# Patient Record
Sex: Female | Born: 1960 | Race: White | Hispanic: No | Marital: Single | Smoking: Current every day smoker
Health system: Southern US, Community
[De-identification: ages and names within clinical notes are randomized; demographics above are authoritative.]

## PROBLEM LIST (undated history)

## (undated) DIAGNOSIS — F419 Anxiety disorder, unspecified: Secondary | ICD-10-CM

## (undated) DIAGNOSIS — J449 Chronic obstructive pulmonary disease, unspecified: Secondary | ICD-10-CM

## (undated) DIAGNOSIS — G8929 Other chronic pain: Secondary | ICD-10-CM

## (undated) DIAGNOSIS — G4700 Insomnia, unspecified: Secondary | ICD-10-CM

## (undated) DIAGNOSIS — Z8639 Personal history of other endocrine, nutritional and metabolic disease: Secondary | ICD-10-CM

## (undated) DIAGNOSIS — G629 Polyneuropathy, unspecified: Secondary | ICD-10-CM

## (undated) DIAGNOSIS — E7800 Pure hypercholesterolemia, unspecified: Secondary | ICD-10-CM

## (undated) HISTORY — PX: ABLATION: SHX5711

## (undated) HISTORY — DX: Insomnia, unspecified: G47.00

## (undated) HISTORY — DX: Chronic obstructive pulmonary disease, unspecified: J44.9

## (undated) HISTORY — DX: Anxiety disorder, unspecified: F41.9

## (undated) HISTORY — DX: Other chronic pain: G89.29

## (undated) HISTORY — PX: ABDOMINAL HYSTERECTOMY: SHX81

## (undated) HISTORY — PX: LAPAROSCOPIC SALPINGO OOPHERECTOMY: SHX5927

## (undated) HISTORY — DX: Pure hypercholesterolemia, unspecified: E78.00

## (undated) HISTORY — DX: Personal history of other endocrine, nutritional and metabolic disease: Z86.39

## (undated) HISTORY — DX: Polyneuropathy, unspecified: G62.9

---

## 2013-11-08 ENCOUNTER — Emergency Department (HOSPITAL_COMMUNITY)
Admission: EM | Admit: 2013-11-08 | Discharge: 2013-11-08 | Disposition: A | Discharge status: Home / Self Care | Payer: Self-pay | Attending: Emergency Medicine | Admitting: Emergency Medicine

## 2013-11-08 ENCOUNTER — Encounter (HOSPITAL_COMMUNITY): Payer: Self-pay | Admitting: Emergency Medicine

## 2013-11-08 ENCOUNTER — Emergency Department (HOSPITAL_COMMUNITY): Payer: Self-pay

## 2013-11-08 DIAGNOSIS — M5430 Sciatica, unspecified side: Secondary | ICD-10-CM | POA: Insufficient documentation

## 2013-11-08 DIAGNOSIS — M5432 Sciatica, left side: Secondary | ICD-10-CM

## 2013-11-08 DIAGNOSIS — F17200 Nicotine dependence, unspecified, uncomplicated: Secondary | ICD-10-CM | POA: Insufficient documentation

## 2013-11-08 LAB — URINALYSIS, ROUTINE W REFLEX MICROSCOPIC
BILIRUBIN URINE: NEGATIVE
Glucose, UA: NEGATIVE mg/dL
HGB URINE DIPSTICK: NEGATIVE
KETONES UR: NEGATIVE mg/dL
Leukocytes, UA: NEGATIVE
NITRITE: NEGATIVE
PROTEIN: NEGATIVE mg/dL
Specific Gravity, Urine: 1.014 (ref 1.005–1.030)
UROBILINOGEN UA: 0.2 mg/dL (ref 0.0–1.0)
pH: 7 (ref 5.0–8.0)

## 2013-11-08 MED ORDER — DIAZEPAM 5 MG PO TABS: 5.0000 mg | ORAL_TABLET | Freq: Four times a day (QID) | ORAL | Status: DC | PRN

## 2013-11-08 MED ORDER — OXYCODONE-ACETAMINOPHEN 5-325 MG PO TABS
1.0000 | ORAL_TABLET | Freq: Once | ORAL | Status: AC
  Administered 2013-11-08: 1 via ORAL
  Filled 2013-11-08: qty 1

## 2013-11-08 MED ORDER — DIAZEPAM 5 MG PO TABS
5.0000 mg | ORAL_TABLET | Freq: Once | ORAL | Status: AC
  Administered 2013-11-08: 5 mg via ORAL
  Filled 2013-11-08: qty 1

## 2013-11-08 MED ORDER — OXYCODONE-ACETAMINOPHEN 5-325 MG PO TABS: 1.0000 | ORAL_TABLET | ORAL | Status: DC | PRN

## 2013-11-08 NOTE — ED Notes (Signed)
Patient transported to X-ray 

## 2013-11-08 NOTE — ED Provider Notes (Signed)
CSN: [id number]     Arrival date & time 11/08/13  1047 History  This chart was scribed for non-physician practitioner, Mardee Postin, working with Shon Baton, MD by Smiley Houseman, ED Scribe. This patient was seen in room WTR7/WTR7 and the patient's care was started at 12:15 PM.  Chief Complaint  Patient presents with  . Back Pain   The history is provided by the patient. No language interpreter was used.   HPI Comments: Terri Cameron is a 53 y.o. female who presents to the Emergency Department complaining of constant back pain that started about 11 days ago.  Pt states that the pain began when she would move a certain way, but currently the pain is constant.  She reports the pain radiates into her hips bilaterally, but denies radiation into her abdomen.  Pt denies any known injury to the area.  She denies bowel incontinence or urinary difficulties.  Pt reports she has tried tylenol and ibuprofen without relief.  Pt has h/o of sciatica, but she denies this instance as similar pain.   History reviewed. No pertinent past medical history. Past Surgical History  Procedure Laterality Date  . Laparoscopic salpingo oopherectomy    . Ablation     No family history on file. History  Substance Use Topics  . Smoking status: Current Every Day Smoker -- 1.00 packs/day    Types: Cigarettes  . Smokeless tobacco: Not on file  . Alcohol Use: Not on file   OB History   Grav Para Term Preterm Abortions TAB SAB Ect Mult Living                 Review of Systems  Constitutional: Negative for fever and chills.  HENT: Negative for congestion, rhinorrhea and sore throat.   Respiratory: Negative for cough and shortness of breath.   Cardiovascular: Negative for chest pain.  Gastrointestinal: Negative for nausea, vomiting, abdominal pain and diarrhea.  Genitourinary: Negative for difficulty urinating.  Musculoskeletal: Positive for back pain. Negative for neck pain and neck stiffness.  Skin:  Negative for color change and rash.  Neurological: Negative for syncope.  All other systems reviewed and are negative.    Allergies  Review of patient's allergies indicates no known allergies.  Home Medications   Current Outpatient Rx  Name  Route  Sig  Dispense  Refill  . acetaminophen (TYLENOL) 325 MG tablet   Oral   Take 650 mg by mouth every 6 (six) hours as needed.         Marland Kitchen ibuprofen (ADVIL,MOTRIN) 200 MG tablet   Oral   Take 400 mg by mouth every 6 (six) hours as needed.          Triage Vitals: BP 124/59  Pulse 86  Temp(Src) 98.1 F (36.7 C)  Resp 20  SpO2 98% Physical Exam  Nursing note and vitals reviewed. Constitutional: She is oriented to person, place, and time. She appears well-developed and well-nourished. No distress.  HENT:  Head: Normocephalic and atraumatic.  Right Ear: External ear normal.  Left Ear: External ear normal.  Nose: Nose normal.  Mouth/Throat: Oropharynx is clear and moist. No oropharyngeal exudate.  Eyes: Conjunctivae are normal. Pupils are equal, round, and reactive to light. No scleral icterus.  Neck: Normal range of motion. Neck supple.  Cardiovascular: Normal rate, regular rhythm and normal heart sounds.  Exam reveals no gallop and no friction rub.   No murmur heard. Pulmonary/Chest: Effort normal and breath sounds normal. No respiratory distress. She has  no wheezes. She has no rales. She exhibits no tenderness.  Abdominal: Soft. Bowel sounds are normal. She exhibits no distension. There is no tenderness.  Musculoskeletal: She exhibits tenderness. She exhibits no edema.       Lumbar back: She exhibits decreased range of motion, tenderness and bony tenderness.       Back:  Lymphadenopathy:    She has no cervical adenopathy.  Neurological: She is alert and oriented to person, place, and time. She exhibits normal muscle tone. Coordination normal.  Skin: Skin is warm and dry. No rash noted. No erythema.  Psychiatric: She has a  normal mood and affect. Her behavior is normal. Judgment and thought content normal.    ED Course  Procedures (including critical care time) DIAGNOSTIC STUDIES: Oxygen Saturation is 98% on RA, normal by my interpretation.    COORDINATION OF CARE: 12:15 PM- Will order lumbar spine x-ray and pain medication. Patient informed of current plan of treatment and evaluation and agrees with plan.    Labs Review Labs Reviewed  URINALYSIS, ROUTINE W REFLEX MICROSCOPIC   Imaging Review Dg Lumbar Spine Complete  11/08/2013   CLINICAL DATA:  Low left-sided back pain  EXAM: LUMBAR SPINE - COMPLETE 4+ VIEW  COMPARISON:  None.  FINDINGS: There are 5 nonrib bearing lumbar-type vertebral bodies. The vertebral body heights are maintained. The alignment is anatomic. There is no spondylolysis. There is no acute fracture or static listhesis. There is mild degenerative disc disease at L4-5 and L5-S1.  The SI joints are unremarkable.  There is abdominal aortic atherosclerosis.  IMPRESSION: No acute osseous injury of the lumbar spine.   Electronically Signed   By: Elige KoHetal  Patel   On: 11/08/2013 13:20   MDM  Sciatica left  Patient here with left lower back pain with radiation around to left hip - pain worse with movement, no alarming signs to suggest cauda equina, x-ray negative as is urine.  I personally performed the services described in this documentation, which was scribed in my presence. The recorded information has been reviewed and is accurate.   Izola PriceFrances C. Marisue HumbleSanford, PA-C 11/08/13 1346

## 2013-11-08 NOTE — Discharge Instructions (Signed)
Lumbosacral Strain °Lumbosacral strain is a strain of any of the parts that make up your lumbosacral vertebrae. Your lumbosacral vertebrae are the bones that make up the lower third of your backbone. Your lumbosacral vertebrae are held together by muscles and tough, fibrous tissue (ligaments).  °CAUSES  °A sudden blow to your back can cause lumbosacral strain. Also, anything that causes an excessive stretch of the muscles in the low back can cause this strain. This is typically seen when people exert themselves strenuously, fall, lift heavy objects, bend, or crouch repeatedly. °RISK FACTORS °· Physically demanding work. °· Participation in pushing or pulling sports or sports that require sudden twist of the back (tennis, golf, baseball). °· Weight lifting. °· Excessive lower back curvature. °· Forward-tilted pelvis. °· Weak back or abdominal muscles or both. °· Tight hamstrings. °SIGNS AND SYMPTOMS  °Lumbosacral strain may cause pain in the area of your injury or pain that moves (radiates) down your leg.  °DIAGNOSIS °Your health care provider can often diagnose lumbosacral strain through a physical exam. In some cases, you may need tests such as X-ray exams.  °TREATMENT  °Treatment for your lower back injury depends on many factors that your clinician will have to evaluate. However, most treatment will include the use of anti-inflammatory medicines. °HOME CARE INSTRUCTIONS  °· Avoid hard physical activities (tennis, racquetball, waterskiing) if you are not in proper physical condition for it. This may aggravate or create problems. °· If you have a back problem, avoid sports requiring sudden body movements. Swimming and walking are generally safer activities. °· Maintain good posture. °· Maintain a healthy weight. °· For acute conditions, you may put ice on the injured area. °· Put ice in a plastic bag. °· Place a towel between your skin and the bag. °· Leave the ice on for 20 minutes, 2 3 times a day. °· When the  low back starts healing, stretching and strengthening exercises may be recommended. °SEEK MEDICAL CARE IF: °· Your back pain is getting worse. °· You experience severe back pain not relieved with medicines. °SEEK IMMEDIATE MEDICAL CARE IF:  °· You have numbness, tingling, weakness, or problems with the use of your arms or legs. °· There is a change in bowel or bladder control. °· You have increasing pain in any area of the body, including your belly (abdomen). °· You notice shortness of breath, dizziness, or feel faint. °· You feel sick to your stomach (nauseous), are throwing up (vomiting), or become sweaty. °· You notice discoloration of your toes or legs, or your feet get very cold. °MAKE SURE YOU:  °· Understand these instructions. °· Will watch your condition. °· Will get help right away if you are not doing well or get worse. °Document Released: 07/10/2005 Document Revised: 07/21/2013 Document Reviewed: 05/19/2013 °ExitCare® Patient Information ©2014 [employer]. ° °Sciatica °Sciatica is pain, weakness, numbness, or tingling along the path of the sciatic nerve. The nerve starts in the lower back and runs down the back of each leg. The nerve controls the muscles in the lower leg and in the back of the knee, while also providing sensation to the back of the thigh, lower leg, and the sole of your foot. Sciatica is a symptom of another medical condition. For instance, nerve damage or certain conditions, such as a herniated disk or bone spur on the spine, pinch or put pressure on the sciatic nerve. This causes the pain, weakness, or other sensations normally associated with sciatica. Generally, sciatica only affects   one side of the body. °CAUSES  °· Herniated or slipped disc. °· Degenerative disk disease. °· A pain disorder involving the narrow muscle in the buttocks (piriformis syndrome). °· Pelvic injury or fracture. °· Pregnancy. °· Tumor (rare). °SYMPTOMS  °Symptoms can vary from mild to very severe. The  symptoms usually travel from the low back to the buttocks and down the back of the leg. Symptoms can include: °· Mild tingling or dull aches in the lower back, leg, or hip. °· Numbness in the back of the calf or sole of the foot. °· Burning sensations in the lower back, leg, or hip. °· Sharp pains in the lower back, leg, or hip. °· Leg weakness. °· Severe back pain inhibiting movement. °These symptoms may get worse with coughing, sneezing, laughing, or prolonged sitting or standing. Also, being overweight may worsen symptoms. °DIAGNOSIS  °Your caregiver will perform a physical exam to look for common symptoms of sciatica. He or she may ask you to do certain movements or activities that would trigger sciatic nerve pain. Other tests may be performed to find the cause of the sciatica. These may include: °· Blood tests. °· X-rays. °· Imaging tests, such as an MRI or CT scan. °TREATMENT  °Treatment is directed at the cause of the sciatic pain. Sometimes, treatment is not necessary and the pain and discomfort goes away on its own. If treatment is needed, your caregiver may suggest: °· Over-the-counter medicines to relieve pain. °· Prescription medicines, such as anti-inflammatory medicine, muscle relaxants, or narcotics. °· Applying heat or ice to the painful area. °· Steroid injections to lessen pain, irritation, and inflammation around the nerve. °· Reducing activity during periods of pain. °· Exercising and stretching to strengthen your abdomen and improve flexibility of your spine. Your caregiver may suggest losing weight if the extra weight makes the back pain worse. °· Physical therapy. °· Surgery to eliminate what is pressing or pinching the nerve, such as a bone spur or part of a herniated disk. °HOME CARE INSTRUCTIONS  °· Only take over-the-counter or prescription medicines for pain or discomfort as directed by your caregiver. °· Apply ice to the affected area for 20 minutes, 3 4 times a day for the first 48 72  hours. Then try heat in the same way. °· Exercise, stretch, or perform your usual activities if these do not aggravate your pain. °· Attend physical therapy sessions as directed by your caregiver. °· Keep all follow-up appointments as directed by your caregiver. °· Do not wear high heels or shoes that do not provide proper support. °· Check your mattress to see if it is too soft. A firm mattress may lessen your pain and discomfort. °SEEK IMMEDIATE MEDICAL CARE IF:  °· You lose control of your bowel or bladder (incontinence). °· You have increasing weakness in the lower back, pelvis, buttocks, or legs. °· You have redness or swelling of your back. °· You have a burning sensation when you urinate. °· You have pain that gets worse when you lie down or awakens you at night. °· Your pain is worse than you have experienced in the past. °· Your pain is lasting longer than 4 weeks. °· You are suddenly losing weight without reason. °MAKE SURE YOU: °· Understand these instructions. °· Will watch your condition. °· Will get help right away if you are not doing well or get worse. °Document Released: 09/24/2001 Document Revised: 03/31/2012 Document Reviewed: 02/09/2012 °ExitCare® Patient Information ©2014 [employer]. ° °

## 2013-11-08 NOTE — ED Provider Notes (Signed)
Medical screening examination/treatment/procedure(s) were performed by non-physician practitioner and as supervising physician I was immediately available for consultation/collaboration.  EKG Interpretation   None        Courtney F Horton, MD 11/08/13 1936 

## 2013-11-08 NOTE — Progress Notes (Signed)
[facility] provided pt with a list of primary care resources, ACA information, and a [facility] Orange Card application.  °

## 2013-11-08 NOTE — ED Notes (Signed)
Pt c/o lower back pain; previous history of sciatica

## 2013-12-31 ENCOUNTER — Encounter (HOSPITAL_COMMUNITY): Payer: Self-pay | Admitting: Emergency Medicine

## 2013-12-31 ENCOUNTER — Emergency Department (HOSPITAL_COMMUNITY): Payer: Worker's Compensation

## 2013-12-31 ENCOUNTER — Emergency Department (HOSPITAL_COMMUNITY)
Admission: EM | Admit: 2013-12-31 | Discharge: 2013-12-31 | Disposition: A | Discharge status: Home / Self Care | Payer: Worker's Compensation | Attending: Emergency Medicine | Admitting: Emergency Medicine

## 2013-12-31 DIAGNOSIS — S161XXA Strain of muscle, fascia and tendon at neck level, initial encounter: Secondary | ICD-10-CM

## 2013-12-31 DIAGNOSIS — R110 Nausea: Secondary | ICD-10-CM | POA: Insufficient documentation

## 2013-12-31 DIAGNOSIS — S46909A Unspecified injury of unspecified muscle, fascia and tendon at shoulder and upper arm level, unspecified arm, initial encounter: Secondary | ICD-10-CM | POA: Insufficient documentation

## 2013-12-31 DIAGNOSIS — S139XXA Sprain of joints and ligaments of unspecified parts of neck, initial encounter: Secondary | ICD-10-CM | POA: Insufficient documentation

## 2013-12-31 DIAGNOSIS — F17200 Nicotine dependence, unspecified, uncomplicated: Secondary | ICD-10-CM | POA: Insufficient documentation

## 2013-12-31 DIAGNOSIS — M549 Dorsalgia, unspecified: Secondary | ICD-10-CM

## 2013-12-31 DIAGNOSIS — Y92410 Unspecified street and highway as the place of occurrence of the external cause: Secondary | ICD-10-CM | POA: Insufficient documentation

## 2013-12-31 DIAGNOSIS — Y9389 Activity, other specified: Secondary | ICD-10-CM | POA: Insufficient documentation

## 2013-12-31 DIAGNOSIS — IMO0002 Reserved for concepts with insufficient information to code with codable children: Secondary | ICD-10-CM | POA: Insufficient documentation

## 2013-12-31 DIAGNOSIS — R42 Dizziness and giddiness: Secondary | ICD-10-CM | POA: Insufficient documentation

## 2013-12-31 DIAGNOSIS — Z9079 Acquired absence of other genital organ(s): Secondary | ICD-10-CM | POA: Insufficient documentation

## 2013-12-31 DIAGNOSIS — S4980XA Other specified injuries of shoulder and upper arm, unspecified arm, initial encounter: Secondary | ICD-10-CM | POA: Insufficient documentation

## 2013-12-31 DIAGNOSIS — G8929 Other chronic pain: Secondary | ICD-10-CM | POA: Insufficient documentation

## 2013-12-31 MED ORDER — OXYCODONE-ACETAMINOPHEN 5-325 MG PO TABS
2.0000 | ORAL_TABLET | Freq: Once | ORAL | Status: AC
  Administered 2013-12-31: 2 via ORAL
  Filled 2013-12-31: qty 2

## 2013-12-31 MED ORDER — METHOCARBAMOL 500 MG PO TABS
500.0000 mg | ORAL_TABLET | Freq: Once | ORAL | Status: AC
  Administered 2013-12-31: 500 mg via ORAL
  Filled 2013-12-31: qty 1

## 2013-12-31 MED ORDER — MELOXICAM 7.5 MG PO TABS: 15.0000 mg | ORAL_TABLET | Freq: Every day | ORAL | Status: DC

## 2013-12-31 MED ORDER — HYDROCODONE-ACETAMINOPHEN 5-325 MG PO TABS: 1.0000 | ORAL_TABLET | Freq: Four times a day (QID) | ORAL | Status: DC | PRN

## 2013-12-31 MED ORDER — METHOCARBAMOL 500 MG PO TABS: 500.0000 mg | ORAL_TABLET | Freq: Two times a day (BID) | ORAL | Status: DC
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
## 2013-12-31 NOTE — Discharge Instructions (Signed)
Recommend ice to the area 3 times per day for the next 72 hours followed by alternating ice and heat packs. Take Mobic as prescribed for pain and inflammation and Robaxin as prescribed for muscle spasms. You may take Norco as prescribed for breakthrough pain control. Followup with your primary care doctor. Return if symptoms worsen.  Cervical Sprain A cervical sprain is an injury in the neck in which the strong, fibrous tissues (ligaments) that connect your neck bones stretch or tear. Cervical sprains can range from mild to severe. Severe cervical sprains can cause the neck vertebrae to be unstable. This can lead to damage of the spinal cord and can result in serious nervous system problems. The amount of time it takes for a cervical sprain to get better depends on the cause and extent of the injury. Most cervical sprains heal in 1 to 3 weeks. CAUSES  Severe cervical sprains may be caused by:   Contact sport injuries (such as from football, rugby, wrestling, hockey, auto racing, gymnastics, diving, martial arts, or boxing).   Motor vehicle collisions.   Whiplash injuries. This is an injury from a sudden forward-and backward whipping movement of the head and neck.  Falls.  Mild cervical sprains may be caused by:   Being in an awkward position, such as while cradling a telephone between your ear and shoulder.   Sitting in a chair that does not offer proper support.   Working at a poorly Marketing executivedesigned computer station.   Looking up or down for long periods of time.  SYMPTOMS   Pain, soreness, stiffness, or a burning sensation in the front, back, or sides of the neck. This discomfort may develop immediately after the injury or slowly, 24 hours or more after the injury.   Pain or tenderness directly in the middle of the back of the neck.   Shoulder or upper back pain.   Limited ability to move the neck.   Headache.   Dizziness.   Weakness, numbness, or tingling in the hands  or arms.   Muscle spasms.   Difficulty swallowing or chewing.   Tenderness and swelling of the neck.  DIAGNOSIS  Most of the time your health care provider can diagnose a cervical sprain by taking your history and doing a physical exam. Your health care provider will ask about previous neck injuries and any known neck problems, such as arthritis in the neck. X-rays may be taken to find out if there are any other problems, such as with the bones of the neck. Other tests, such as a CT scan or MRI, may also be needed.  TREATMENT  Treatment depends on the severity of the cervical sprain. Mild sprains can be treated with rest, keeping the neck in place (immobilization), and pain medicines. Severe cervical sprains are immediately immobilized. Further treatment is done to help with pain, muscle spasms, and other symptoms and may include:  Medicines, such as pain relievers, numbing medicines, or muscle relaxants.   Physical therapy. This may involve stretching exercises, strengthening exercises, and posture training. Exercises and improved posture can help stabilize the neck, strengthen muscles, and help stop symptoms from returning.  HOME CARE INSTRUCTIONS   Put ice on the injured area.   Put ice in a plastic bag.   Place a towel between your skin and the bag.   Leave the ice on for 15 20 minutes, 3 4 times a day.   If your injury was severe, you may have been given a cervical collar  to wear. A cervical collar is a two-piece collar designed to keep your neck from moving while it heals.  Do not remove the collar unless instructed by your health care provider.  If you have long hair, keep it outside of the collar.  Ask your health care provider before making any adjustments to your collar. Minor adjustments may be required over time to improve comfort and reduce pressure on your chin or on the back of your head.  Ifyou are allowed to remove the collar for cleaning or bathing,  follow your health care provider's instructions on how to do so safely.  Keep your collar clean by wiping it with mild soap and water and drying it completely. If the collar you have been given includes removable pads, remove them every 1 2 days and hand wash them with soap and water. Allow them to air dry. They should be completely dry before you wear them in the collar.  If you are allowed to remove the collar for cleaning and bathing, wash and dry the skin of your neck. Check your skin for irritation or sores. If you see any, tell your health care provider.  Do not drive while wearing the collar.   Only take over-the-counter or prescription medicines for pain, discomfort, or fever as directed by your health care provider.   Keep all follow-up appointments as directed by your health care provider.   Keep all physical therapy appointments as directed by your health care provider.   Make any needed adjustments to your workstation to promote good posture.   Avoid positions and activities that make your symptoms worse.   Warm up and stretch before being active to help prevent problems.  SEEK MEDICAL CARE IF:   Your pain is not controlled with medicine.   You are unable to decrease your pain medicine over time as planned.   Your activity level is not improving as expected.  SEEK IMMEDIATE MEDICAL CARE IF:   You develop any bleeding.  You develop stomach upset.  You have signs of an allergic reaction to your medicine.   Your symptoms get worse.   You develop new, unexplained symptoms.   You have numbness, tingling, weakness, or paralysis in any part of your body.  MAKE SURE YOU:   Understand these instructions.  Will watch your condition.  Will get help right away if you are not doing well or get worse. Document Released: 07/28/2007 Document Revised: 07/21/2013 Document Reviewed: 04/07/2013 [facility] Patient Information 2014 [place], [place].
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
## 2014-01-02 NOTE — ED Provider Notes (Signed)
Medical screening examination/treatment/procedure(s) were performed by non-physician practitioner and as supervising physician I was immediately available for consultation/collaboration.   EKG Interpretation None        Avedis Bevis E Mikela Senn, MD 01/02/14 1602 

## 2014-12-22 ENCOUNTER — Emergency Department (INDEPENDENT_AMBULATORY_CARE_PROVIDER_SITE_OTHER): Payer: No Typology Code available for payment source

## 2014-12-22 ENCOUNTER — Emergency Department (HOSPITAL_COMMUNITY)
Admission: EM | Admit: 2014-12-22 | Discharge: 2014-12-22 | Disposition: A | Discharge status: Home / Self Care | Payer: No Typology Code available for payment source | Source: Home / Self Care | Attending: Family Medicine | Admitting: Family Medicine

## 2014-12-22 ENCOUNTER — Encounter (HOSPITAL_COMMUNITY): Payer: Self-pay | Admitting: Emergency Medicine

## 2014-12-22 DIAGNOSIS — S9002XA Contusion of left ankle, initial encounter: Secondary | ICD-10-CM | POA: Diagnosis not present

## 2014-12-22 MED ORDER — DICLOFENAC SODIUM 75 MG PO TBEC: 75.0000 mg | DELAYED_RELEASE_TABLET | Freq: Two times a day (BID) | ORAL | Status: DC | PRN

## 2014-12-22 NOTE — ED Provider Notes (Signed)
Terri Cameron is a 54 y.o. female who presents to Urgent Care today for ankle pain. Patient dropped a heavy object on her left foot and ankle yesterday at home. She notes pain and swelling along the lateral ankle and foot. Pain is worse with activity. No radiating pain weakness or numbness. Patient's tried over-the-counter medications which have not helped much. No fevers or chills nausea vomiting or diarrhea.   History reviewed. No pertinent past medical history. Past Surgical History  Procedure Laterality Date  . Laparoscopic salpingo oopherectomy    . Ablation     History  Substance Use Topics  . Smoking status: Current Every Day Smoker -- 1.00 packs/day    Types: Cigarettes  . Smokeless tobacco: Not on file  . Alcohol Use: Yes     Comment: occasional   ROS as above Medications: No current facility-administered medications for this encounter.   Current Outpatient Prescriptions  Medication Sig Dispense Refill  . diclofenac (VOLTAREN) 75 MG EC tablet Take 1 tablet (75 mg total) by mouth 2 (two) times daily as needed. 30 tablet 0   No Known Allergies   Exam:  BP 121/67 mmHg  Pulse 67  Temp(Src) 98.6 F (37 C) (Oral)  Resp 14  SpO2 100% Gen: Well NAD Left leg: Hip normal range of motion Knee nontender normal neck range of motion stable ligamentous exam Ankle swollen laterally tender lateral malleolus Foot normal-appearing no swelling tender proximal fifth metatarsal Pulses capillary refill sensation are normal.  No results found for this or any previous visit (from the past 24 hour(s)). Dg Ankle Complete Left  12/22/2014   CLINICAL DATA:  Lateral foot pain after dropping object on foot. Initial encounter.  EXAM: LEFT ANKLE COMPLETE - 3+ VIEW  COMPARISON:  None.  FINDINGS: The mineralization and alignment are normal. There is no evidence of acute fracture or dislocation. The joint spaces are maintained. Density overlying the medial aspect of the talar dome on the AP and  oblique views is not clearly seen on the lateral view. This does not appear to reflect an osteochondral lesion, although it is unclear as to whether this reflects an incidental bone island or an adjacent ossicle. This is of doubtful clinical significance. No focal soft tissue swelling identified.  IMPRESSION: No acute osseous findings.   Electronically Signed   By: Carey BullocksWilliam  Veazey M.D.   On: 12/22/2014 15:43   Dg Foot Complete Left  12/22/2014   CLINICAL DATA:  Lateral foot pain after dropping object on foot. Initial encounter.  EXAM: LEFT FOOT - COMPLETE 3+ VIEW  COMPARISON:  None.  FINDINGS: The mineralization and alignment are normal. There is no evidence of acute fracture or dislocation. No foreign body or focal soft tissue swelling identified. There are mild degenerative changes at the first metatarsal phalangeal joint.  IMPRESSION: No acute osseous findings.   Electronically Signed   By: Carey BullocksWilliam  Veazey M.D.   On: 12/22/2014 15:40    Assessment and Plan: 54 y.o. female with ankle contusion. Patient is completely nontender on the medial aspect. I believe the x-ray abnormality is benign finding. I offered patient a cam walker boot, and she felt that it helped.  Plan to treat with Cam walker prn, NSAIDs and follow up with sports medicine as needed. Continue elevation and cryotherapy as needed.  Discussed warning signs or symptoms. Please see discharge instructions. Patient expresses understanding.     Rodolph BongEvan S Kyandre Okray, MD 12/22/14 [phone]

## 2014-12-22 NOTE — Discharge Instructions (Signed)
Thank you for coming in today. Use the cam walker as needed Take diclofenac for pain as needed  Contusion A contusion is the result of an injury to the skin and underlying tissues and is usually caused by direct trauma. The injury results in the appearance of a bruise on the skin overlying the injured tissues. Contusions cause rupture and bleeding of the small capillaries and blood vessels and affect function, because the bleeding infiltrates muscles, tendons, nerves, or other soft tissues.  SYMPTOMS   Swelling and often a hard lump in the injured area, either superficial or deep.  Pain and tenderness over the area of the contusion.  Feeling of firmness when pressure is exerted over the contusion.  Discoloration under the skin, beginning with redness and progressing to the characteristic "black and blue" bruise. CAUSES  A contusion is typically the result of direct trauma. This is often by a blunt object.  RISK INCREASES WITH:  Sports that have a high likelihood of trauma (football, boxing, ice hockey, soccer, field hockey, martial arts, basketball, and baseball).  Sports that make falling from a height likely (high-jumping, pole-vaulting, skating, or gymnastics).  Any bleeding disorder (hemophilia) or taking medications that affect clotting (aspirin, nonsteroidal anti-inflammatory medications, or warfarin [Coumadin]).  Inadequate protection of exposed areas during contact sports. PREVENTION  Maintain physical fitness:  Joint and muscle flexibility.  Strength and endurance.  Coordination.  Wear proper protective equipment. Make sure it fits correctly. PROGNOSIS  Contusions typically heal without any complications. Healing time varies with the severity of injury and intake of medications that affect clotting. Contusions usually heal in 1 to 4 weeks. RELATED COMPLICATIONS   Damage to nearby nerves or blood vessels, causing numbness, coldness, or paleness.  Compartment  syndrome.  Bleeding into the soft tissues that leads to disability.  Infiltrative-type bleeding, leading to the calcification and impaired function of the injured muscle (rare).  Prolonged healing time if usual activities are resumed too soon.  Infection if the skin over the injury site is broken.  Fracture of the bone underlying the contusion.  Stiffness in the joint where the injured muscle crosses. TREATMENT  Treatment initially consists of resting the injured area as well as medication and ice to reduce inflammation. The use of a compression bandage may also be helpful in minimizing inflammation. As pain diminishes and movement is tolerated, the joint where the affected muscle crosses should be moved to prevent stiffness and the shortening (contracture) of the joint. Movement of the joint should begin as soon as possible. It is also important to work on maintaining strength within the affected muscles. Occasionally, extra padding over the area of contusion may be recommended before returning to sports, particularly if re-injury is likely.  MEDICATION   If pain relief is necessary these medications are often recommended:  Nonsteroidal anti-inflammatory medications, such as aspirin and ibuprofen.  Other minor pain relievers, such as acetaminophen, are often recommended.  Prescription pain relievers may be given by your caregiver. Use only as directed and only as much as you need. HEAT AND COLD  Cold treatment (icing) relieves pain and reduces inflammation. Cold treatment should be applied for 10 to 15 minutes every 2 to 3 hours for inflammation and pain and immediately after any activity that aggravates your symptoms. Use ice packs or an ice massage. (To do an ice massage fill a large styrofoam cup with water and freeze. Tear a small amount of foam from the top so ice protrudes. Massage ice firmly over the  injured area in a circle about the size of a softball.)  Heat treatment may be  used prior to performing the stretching and strengthening activities prescribed by your caregiver, physical therapist, or athletic trainer. Use a heat pack or a warm soak. SEEK MEDICAL CARE IF:   Symptoms get worse or do not improve despite treatment in a few days.  You have difficulty moving a joint.  Any extremity becomes extremely painful, numb, pale, or cool (This is an emergency!).  Medication produces any side effects (bleeding, upset stomach, or allergic reaction).  Signs of infection (drainage from skin, headache, muscle aches, dizziness, fever, or general ill feeling) occur if skin was broken. Document Released: 09/30/2005 Document Revised: 12/23/2011 Document Reviewed: 01/12/2009 [facility] Patient Information 2015 [place]. This information is not intended to replace advice given to you by your health care provider. Make sure you discuss any questions you have with your health care provider.   Cryotherapy Cryotherapy means treatment with cold. Ice or gel packs can be used to reduce both pain and swelling. Ice is the most helpful within the first 24 to 48 hours after an injury or flare-up from overusing a muscle or joint. Sprains, strains, spasms, burning pain, shooting pain, and aches can all be eased with ice. Ice can also be used when recovering from surgery. Ice is effective, has very few side effects, and is safe for most people to use. PRECAUTIONS  Ice is not a safe treatment option for people with:  Raynaud phenomenon. This is a condition affecting small blood vessels in the extremities. Exposure to cold may cause your problems to return.  Cold hypersensitivity. There are many forms of cold hypersensitivity, including:  Cold urticaria. Red, itchy hives appear on the skin when the tissues begin to warm after being iced.  Cold erythema. This is a red, itchy rash caused by exposure to cold.  Cold hemoglobinuria. Red blood cells break down when the tissues begin to warm  after being iced. The hemoglobin that carry oxygen are passed into the urine because they cannot combine with blood proteins fast enough.  Numbness or altered sensitivity in the area being iced. If you have any of the following conditions, do not use ice until you have discussed cryotherapy with your caregiver:  Heart conditions, such as arrhythmia, angina, or chronic heart disease.  High blood pressure.  Healing wounds or open skin in the area being iced.  Current infections.  Rheumatoid arthritis.  Poor circulation.  Diabetes. Ice slows the blood flow in the region it is applied. This is beneficial when trying to stop inflamed tissues from spreading irritating chemicals to surrounding tissues. However, if you expose your skin to cold temperatures for too long or without the proper protection, you can damage your skin or nerves. Watch for signs of skin damage due to cold. HOME CARE INSTRUCTIONS Follow these tips to use ice and cold packs safely.  Place a dry or damp towel between the ice and skin. A damp towel will cool the skin more quickly, so you may need to shorten the time that the ice is used.  For a more rapid response, add gentle compression to the ice.  Ice for no more than 10 to 20 minutes at a time. The bonier the area you are icing, the less time it will take to get the benefits of ice.  Check your skin after 5 minutes to make sure there are no signs of a poor response to cold or skin  damage.  Rest 20 minutes or more between uses.  Once your skin is numb, you can end your treatment. You can test numbness by very lightly touching your skin. The touch should be so light that you do not see the skin dimple from the pressure of your fingertip. When using ice, most people will feel these normal sensations in this order: cold, burning, aching, and numbness.  Do not use ice on someone who cannot communicate their responses to pain, such as small children or people with  dementia. HOW TO MAKE AN ICE PACK Ice packs are the most common way to use ice therapy. Other methods include ice massage, ice baths, and cryosprays. Muscle creams that cause a cold, tingly feeling do not offer the same benefits that ice offers and should not be used as a substitute unless recommended by your caregiver. To make an ice pack, do one of the following:  Place crushed ice or a bag of frozen vegetables in a sealable plastic bag. Squeeze out the excess air. Place this bag inside another plastic bag. Slide the bag into a pillowcase or place a damp towel between your skin and the bag.  Mix 3 parts water with 1 part rubbing alcohol. Freeze the mixture in a sealable plastic bag. When you remove the mixture from the freezer, it will be slushy. Squeeze out the excess air. Place this bag inside another plastic bag. Slide the bag into a pillowcase or place a damp towel between your skin and the bag. SEEK MEDICAL CARE IF:  You develop white spots on your skin. This may give the skin a blotchy (mottled) appearance.  Your skin turns blue or pale.  Your skin becomes waxy or hard.  Your swelling gets worse. MAKE SURE YOU:   Understand these instructions.  Will watch your condition.  Will get help right away if you are not doing well or get worse. Document Released: 05/27/2011 Document Revised: 02/14/2014 Document Reviewed: 05/27/2011 [facility] Patient Information 2015 [place], [place]. This information is not intended to replace advice given to you by your health care provider. Make sure you discuss any questions you have with your health care provider.

## 2014-12-22 NOTE — ED Notes (Signed)
Pt states she dropped a 70 lb tote on her left foot/ankle yesterday

## 2015-05-12 ENCOUNTER — Emergency Department (HOSPITAL_COMMUNITY): Payer: No Typology Code available for payment source

## 2015-05-12 ENCOUNTER — Emergency Department (HOSPITAL_COMMUNITY)
Admission: EM | Admit: 2015-05-12 | Discharge: 2015-05-12 | Disposition: A | Discharge status: Home / Self Care | Payer: No Typology Code available for payment source | Attending: Emergency Medicine | Admitting: Emergency Medicine

## 2015-05-12 ENCOUNTER — Encounter (HOSPITAL_COMMUNITY): Payer: Self-pay

## 2015-05-12 DIAGNOSIS — M25522 Pain in left elbow: Secondary | ICD-10-CM | POA: Diagnosis present

## 2015-05-12 DIAGNOSIS — R21 Rash and other nonspecific skin eruption: Secondary | ICD-10-CM | POA: Insufficient documentation

## 2015-05-12 DIAGNOSIS — M2550 Pain in unspecified joint: Secondary | ICD-10-CM | POA: Insufficient documentation

## 2015-05-12 DIAGNOSIS — Z791 Long term (current) use of non-steroidal anti-inflammatories (NSAID): Secondary | ICD-10-CM | POA: Insufficient documentation

## 2015-05-12 MED ORDER — NAPROXEN 500 MG PO TABS: 500.0000 mg | ORAL_TABLET | Freq: Two times a day (BID) | ORAL | Status: DC

## 2015-05-12 MED ORDER — HYDROCORTISONE 1 % EX CREA: TOPICAL_CREAM | CUTANEOUS | Status: DC

## 2015-05-12 MED ORDER — DIPHENHYDRAMINE HCL 25 MG PO TABS: 25.0000 mg | ORAL_TABLET | Freq: Four times a day (QID) | ORAL | Status: DC

## 2015-05-12 NOTE — Discharge Instructions (Signed)
Rash Follow-up with [facility]. Take naproxen for elbow pain. Take Benadryl and apply hydrocortisone cream for rash.  A rash is a change in the color or feel of your skin. There are many different types of rashes. You may have other problems along with your rash. HOME CARE  Avoid the thing that caused your rash.  Do not scratch your rash.  You may take cools baths to help stop itching.  Only take medicines as told by your doctor.  Keep all doctor visits as told. GET HELP RIGHT AWAY IF:   Your pain, puffiness (swelling), or redness gets worse.  You have a fever.  You have new or severe problems.  You have body aches, watery poop (diarrhea), or you throw up (vomit).  Your rash is not better after 3 days. MAKE SURE YOU:   Understand these instructions.  Will watch your condition.  Will get help right away if you are not doing well or get worse. Document Released: 03/18/2008 Document Revised: 12/23/2011 Document Reviewed: 07/15/2011 [facility] Patient Information 2015 [place], [place]. This information is not intended to replace advice given to you by your health care provider. Make sure you discuss any questions you have with your health care provider.  Musculoskeletal Pain Musculoskeletal pain is muscle and boney aches and pains. These pains can occur in any part of the body. Your caregiver may treat you without knowing the cause of the pain. They may treat you if blood or urine tests, X-rays, and other tests were normal.  CAUSES There is often not a definite cause or reason for these pains. These pains may be caused by a type of germ (virus). The discomfort may also come from overuse. Overuse includes working out too hard when your body is not fit. Boney aches also come from weather changes. Bone is sensitive to atmospheric pressure changes. HOME CARE INSTRUCTIONS   Ask when your test results will be ready. Make sure you get your test results.  Only take  over-the-counter or prescription medicines for pain, discomfort, or fever as directed by your caregiver. If you were given medications for your condition, do not drive, operate machinery or power tools, or sign legal documents for 24 hours. Do not drink alcohol. Do not take sleeping pills or other medications that may interfere with treatment.  Continue all activities unless the activities cause more pain. When the pain lessens, slowly resume normal activities. Gradually increase the intensity and duration of the activities or exercise.  During periods of severe pain, bed rest may be helpful. Lay or sit in any position that is comfortable.  Putting ice on the injured area.  Put ice in a bag.  Place a towel between your skin and the bag.  Leave the ice on for 15 to 20 minutes, 3 to 4 times a day.  Follow up with your caregiver for continued problems and no reason can be found for the pain. If the pain becomes worse or does not go away, it may be necessary to repeat tests or do additional testing. Your caregiver may need to look further for a possible cause. SEEK IMMEDIATE MEDICAL CARE IF:  You have pain that is getting worse and is not relieved by medications.  You develop chest pain that is associated with shortness or breath, sweating, feeling sick to your stomach (nauseous), or throw up (vomit).  Your pain becomes localized to the abdomen.  You develop any new symptoms that seem different or that concern you. MAKE  SURE YOU:   Understand these instructions.  Will watch your condition.  Will get help right away if you are not doing well or get worse. Document Released: 09/30/2005 Document Revised: 12/23/2011 Document Reviewed: 06/04/2013 [facility] Patient Information 2015 [place], [place]. This information is not intended to replace advice given to you by your health care provider. Make sure you discuss any questions you have with your health care provider.

## 2015-05-12 NOTE — ED Notes (Signed)
Pt fell x 3 weeks ago and injured left wrist. Not evaluated.  Pt has had pain in elbow x 3 weeks that is not getting better.  Pt also has had rash on rt wrist x 1 week.

## 2015-05-12 NOTE — ED Provider Notes (Signed)
CSN: [id number]     Arrival date & time 05/12/15  1752 History  This chart was scribed for non-physician practitioner Catha Gosselin, PA-C working with Zadie Rhine, MD by Lyndel Safe, ED Scribe. This patient was seen in room WTR9/WTR9 and the patient's care was started at 6:19 PM.   Chief Complaint  Patient presents with  . Elbow Pain  . Rash   The history is provided by the patient. No language interpreter was used.   HPI Comments: Terri Cameron is a 54 y.o. female, with no pertinent PMhx, who presents to the Emergency Department complaining of delayed onset, intermittent, moderate left elbow pain with movement onset 3 weeks. The pt attributes this left elbow pain to a fall that occurred 3 months ago. She notes the pain is present more so in the mornings and additionally when she extends her left arm or picks an object up. She drives vehicles for a living. Pt has taken tylenol and ibuprofen with mild to no relief. Pt is right handed. Denies playing golf or tennis. Pt additionally complains of a constant, diffuse, erythematous, raised rash that is intermittently pruritic. She is with the rash to her anterior knees, ventral right forearm, feet, and back. She states she is fully vaccinated. Pt not followed by a PCP.   History reviewed. No pertinent past medical history. Past Surgical History  Procedure Laterality Date  . Laparoscopic salpingo oopherectomy    . Ablation     History reviewed. No pertinent family history. History  Substance Use Topics  . Smoking status: Current Every Day Smoker -- 1.00 packs/day    Types: Cigarettes  . Smokeless tobacco: Not on file  . Alcohol Use: Yes     Comment: occasional   OB History    No data available     Review of Systems  Musculoskeletal: Positive for arthralgias.  Skin: Positive for rash.   Allergies  Review of patient's allergies indicates no known allergies.  Home Medications   Prior to Admission medications   Medication  Sig Start Date End Date Taking? Authorizing Provider  diclofenac (VOLTAREN) 75 MG EC tablet Take 1 tablet (75 mg total) by mouth 2 (two) times daily as needed. 12/22/14   Rodolph Bong, MD  diphenhydrAMINE (BENADRYL) 25 MG tablet Take 1 tablet (25 mg total) by mouth every 6 (six) hours. 05/12/15   Zowie Lundahl Patel-Mills, PA-C  hydrocortisone cream 1 % Apply to affected area 2 times daily 05/12/15   Catha Gosselin, PA-C  naproxen (NAPROSYN) 500 MG tablet Take 1 tablet (500 mg total) by mouth 2 (two) times daily. 05/12/15   Jarrod Mcenery Patel-Mills, PA-C   BP 137/68 mmHg  Pulse 75  Temp(Src) 98.8 F (37.1 C) (Oral)  Resp 18  SpO2 98% Physical Exam  Constitutional: She is oriented to person, place, and time. She appears well-developed and well-nourished. No distress.  HENT:  Head: Normocephalic and atraumatic.  Eyes: Conjunctivae are normal. Right eye exhibits no discharge. Left eye exhibits no discharge. No scleral icterus.  Neck: Neck supple. No JVD present.  Cardiovascular: Normal rate.   Pulmonary/Chest: Effort normal. No respiratory distress.  Musculoskeletal:  No left elbow effusion. She has tenderness to palpation of the epicondyles. No erythema or ecchymosis. Able to flex and extend the elbow without difficulty.2+ radial pulse.  Neurological: She is alert and oriented to person, place, and time.  Skin: Skin is warm. Rash noted. No erythema. No pallor.  Macular, raised, pruritic, and erythematous rash on the right knee, left thigh,  and right wrist. No surrounding edema or drainage.   Psychiatric: She has a normal mood and affect. Her behavior is normal.  Nursing note and vitals reviewed.  ED Course  Procedures  DIAGNOSTIC STUDIES: Oxygen Saturation is 98% on RA, normal by my interpretation.    COORDINATION OF CARE: 6:24 PM Discussed treatment plan which includes to order Xray of left eblow with pt. Will give referral to [facility]. Pt was offered pain medication but denied  stating she is only in mild pain currently. Pt acknowledges and agrees to plan.   Labs Review Labs Reviewed - No data to display  Imaging Review Dg Elbow Complete Left  05/12/2015   CLINICAL DATA:  Left posterior elbow pain after fall 3 weeks ago.  EXAM: LEFT ELBOW - COMPLETE 3+ VIEW  COMPARISON:  None.  FINDINGS: There is no evidence of fracture, dislocation, or joint effusion. There is no evidence of arthropathy or other focal bone abnormality. Soft tissues are unremarkable.  IMPRESSION: Negative.   Electronically Signed   By: Ellery Plunk M.D.   On: 05/12/2015 18:59   MDM   Final diagnoses:  Left elbow pain  Rash  Patient presents for rash and left elbow pain.  I do not believe this is gout.  It is most likely bursitis. Her xray is negative for joint effusion or fracture.  This is not new for her and she has had this pain for over one month.  I referred her to a physician for further evaluation and possible PT for the arm.  I also gave her hydrocortisone 1% cream and benadryl for the rash.  She can take naproxen for pain.   I personally performed the services described in this documentation, which was scribed in my presence. The recorded information has been reviewed and is accurate.    Catha Gosselin, PA-C 05/12/15 1934  Zadie Rhine, MD 05/12/15 [phone]

## 2015-10-20 ENCOUNTER — Encounter (HOSPITAL_COMMUNITY): Payer: Self-pay | Admitting: Emergency Medicine

## 2015-10-20 ENCOUNTER — Emergency Department (HOSPITAL_COMMUNITY)
Admission: EM | Admit: 2015-10-20 | Discharge: 2015-10-20 | Disposition: A | Discharge status: Home / Self Care | Payer: No Typology Code available for payment source | Attending: Emergency Medicine | Admitting: Emergency Medicine

## 2015-10-20 DIAGNOSIS — Y998 Other external cause status: Secondary | ICD-10-CM | POA: Insufficient documentation

## 2015-10-20 DIAGNOSIS — Y9289 Other specified places as the place of occurrence of the external cause: Secondary | ICD-10-CM | POA: Insufficient documentation

## 2015-10-20 DIAGNOSIS — X509XXA Other and unspecified overexertion or strenuous movements or postures, initial encounter: Secondary | ICD-10-CM | POA: Insufficient documentation

## 2015-10-20 DIAGNOSIS — G8929 Other chronic pain: Secondary | ICD-10-CM | POA: Insufficient documentation

## 2015-10-20 DIAGNOSIS — M25562 Pain in left knee: Secondary | ICD-10-CM

## 2015-10-20 DIAGNOSIS — F17210 Nicotine dependence, cigarettes, uncomplicated: Secondary | ICD-10-CM | POA: Insufficient documentation

## 2015-10-20 DIAGNOSIS — S8992XA Unspecified injury of left lower leg, initial encounter: Secondary | ICD-10-CM | POA: Insufficient documentation

## 2015-10-20 DIAGNOSIS — Y9389 Activity, other specified: Secondary | ICD-10-CM | POA: Insufficient documentation

## 2015-10-20 DIAGNOSIS — Z7952 Long term (current) use of systemic steroids: Secondary | ICD-10-CM | POA: Insufficient documentation

## 2015-10-20 DIAGNOSIS — S39012A Strain of muscle, fascia and tendon of lower back, initial encounter: Secondary | ICD-10-CM

## 2015-10-20 DIAGNOSIS — Z791 Long term (current) use of non-steroidal anti-inflammatories (NSAID): Secondary | ICD-10-CM | POA: Insufficient documentation

## 2015-10-20 DIAGNOSIS — Z79899 Other long term (current) drug therapy: Secondary | ICD-10-CM | POA: Insufficient documentation

## 2015-10-20 MED ORDER — HYDROCODONE-ACETAMINOPHEN 5-325 MG PO TABS: 2.0000 | ORAL_TABLET | ORAL | Status: DC | PRN

## 2015-10-20 MED ORDER — NAPROXEN 500 MG PO TABS: 500.0000 mg | ORAL_TABLET | Freq: Two times a day (BID) | ORAL | Status: DC

## 2015-10-20 NOTE — ED Provider Notes (Signed)
CSN: [id number]     Arrival date & time 10/20/15  0957 History   First MD Initiated Contact with Patient 10/20/15 1016     Chief Complaint  Patient presents with  . Back Pain    pain in r/lower back x 2 weeks  . Knee Pain    3 week hx pain in l/knee     (Consider location/radiation/quality/duration/timing/severity/associated sxs/prior Treatment) HPI   Terri Cameron is a 55 y.o F with a pmhx of chronic knee pain who presents to the ED today c/o left knee pain and back pain. Pt states that her left knee began to hurt approximately 3 weeks ago and the pain has gotten progressively worse. Pain is worsened with extension and weight bearing. Pt states that after walking on it all day her knee will swell up at night time. Pt has been taking motrin with no relief. Pt also c/o R lower back pain that began 3 weeks ago. Pain is worse with movement. No trauma or injury. Denies bowel/bladder incontinence, saddle anesthesia, dysuria, fever, IVDA.   History reviewed. No pertinent past medical history. Past Surgical History  Procedure Laterality Date  . Laparoscopic salpingo oopherectomy    . Ablation     Family History  Problem Relation Age of Onset  . Hypertension Mother   . Hypertension Father   . Diabetes Father    Social History  Substance Use Topics  . Smoking status: Current Every Day Smoker -- 1.00 packs/day    Types: Cigarettes  . Smokeless tobacco: None  . Alcohol Use: Yes     Comment: occasional   OB History    No data available     Review of Systems  All other systems reviewed and are negative.     Allergies  Review of patient's allergies indicates no known allergies.  Home Medications   Prior to Admission medications   Medication Sig Start Date End Date Taking? Authorizing Provider  diclofenac (VOLTAREN) 75 MG EC tablet Take 1 tablet (75 mg total) by mouth 2 (two) times daily as needed. 12/22/14   Rodolph Bong, MD  diphenhydrAMINE (BENADRYL) 25 MG tablet Take 1  tablet (25 mg total) by mouth every 6 (six) hours. 05/12/15   Hanna Patel-Mills, PA-C  hydrocortisone cream 1 % Apply to affected area 2 times daily 05/12/15   Catha Gosselin, PA-C  naproxen (NAPROSYN) 500 MG tablet Take 1 tablet (500 mg total) by mouth 2 (two) times daily. 05/12/15   Hanna Patel-Mills, PA-C   BP 111/64 mmHg  Pulse 69  Temp(Src) 97.8 F (36.6 C) (Oral)  Resp 18  Wt 80.74 kg  SpO2 98% Physical Exam  Constitutional: She is oriented to person, place, and time. She appears well-developed and well-nourished. No distress.  HENT:  Head: Normocephalic and atraumatic.  Eyes: Conjunctivae are normal. Right eye exhibits no discharge. Left eye exhibits no discharge. No scleral icterus.  Neck: Normal range of motion.  Cardiovascular: Normal rate.   Pulmonary/Chest: Effort normal.  Musculoskeletal: Normal range of motion. She exhibits no edema.  Negative anterior/poster drawer bilaterally. Negative ballottement test. No varus or valgus laxity. No crepitus. Pain increased with extension of knee. No edema.   R lumbar paraspinal muscle tenderness. No midline spinal tenderness. No decrease ROM of spine. NO obvious bony deformities.   Neurological: She is alert and oriented to person, place, and time. Coordination normal.  Strength 5/5 throughout. No sensory deficits.  No gait abnormality.  Skin: Skin is warm and dry. No rash  noted. She is not diaphoretic. No erythema. No pallor.  Psychiatric: She has a normal mood and affect. Her behavior is normal.  Nursing note and vitals reviewed.   ED Course  Procedures (including critical care time) Labs Review Labs Reviewed - No data to display  Imaging Review No results found. I have personally reviewed and evaluated these images and lab results as part of my medical decision-making.   EKG Interpretation None      MDM   Final diagnoses:  Left knee pain  Low back strain, initial encounter    Pt with chronic knee pain presents  with exacerbation. No trauma or injury. Pt able to ambulate in ED. No gross abnormality on exam. No advanced imaging necessary at this time. RICE protocol recommended. Pt also with back pain. No neurological deficits and normal neuro exam. No loss of bowel or bladder control.  No concern for cauda equina.  No fever, night sweats, weight loss, h/o cancer, IVDU. Pt advised to follow up with orthopedics for re-evaluation. Patient given knee brace while in ED, conservative therapy recommended and discussed. Patient will be dc home & is agreeable with above plan.     Lester KinsmanSamantha Tripp [place], PA-C 10/20/15 1048  Lavera Guiseana Duo Liu, MD 10/22/15 [phone]

## 2015-10-20 NOTE — ED Notes (Signed)
Pt reports 3 week hx of aching in  R/knee. Increased over last few days. Denies trauma. Also reports aching in r/lower back. Tx with motrin, with no relief. Pt does not have PCP

## 2015-10-20 NOTE — Discharge Instructions (Signed)
Back Injury Prevention Back injuries can be very painful. They can also be difficult to heal. After having one back injury, you are more likely to injure your back again. It is important to learn how to avoid injuring or re-injuring your back. The following tips can help you to prevent a back injury. WHAT SHOULD I KNOW ABOUT PHYSICAL FITNESS?  Exercise for 30 minutes per day on most days of the week or as directed by your health care provider. Make sure to:  Do aerobic exercises, such as walking, jogging, biking, or swimming.  Do exercises that increase balance and strength, such as tai chi and yoga. These can decrease your risk of falling and injuring your back.  Do stretching exercises to help with flexibility.  Try to develop strong abdominal muscles. Your abdominal muscles provide a lot of the support that is needed by your back.  Maintain a healthy weight. This helps to decrease your risk of a back injury. WHAT SHOULD I KNOW ABOUT MY DIET?  Talk with your health care provider about your overall diet. Take supplements and vitamins only as directed by your health care provider.  Talk with your health care provider about how much calcium and vitamin D you need each day. These nutrients help to prevent weakening of the bones (osteoporosis). Osteoporosis can cause broken (fractured) bones, which lead to back pain.  Include good sources of calcium in your diet, such as dairy products, green leafy vegetables, and products that have had calcium added to them (fortified).  Include good sources of vitamin D in your diet, such as milk and foods that are fortified with vitamin D. WHAT SHOULD I KNOW ABOUT MY POSTURE?  Sit up straight and stand up straight. Avoid leaning forward when you sit or hunching over when you stand.  Choose chairs that have good low-back (lumbar) support.  If you work at a desk, sit close to it so you do not need to lean over. Keep your chin tucked in. Keep your neck  drawn back, and keep your elbows bent at a right angle. Your arms should look like the letter "L."  Sit high and close to the steering wheel when you drive. Add a lumbar support to your car seat, if needed.  Avoid sitting or standing in one position for very long. Take breaks to get up, stretch, and walk around at least one time every hour. Take breaks every hour if you are driving for long periods of time.  Sleep on your side with your knees slightly bent, or sleep on your back with a pillow under your knees. Do not lie on the front of your body to sleep. WHAT SHOULD I KNOW ABOUT LIFTING, TWISTING, AND REACHING? Lifting and Heavy Lifting  Avoid heavy lifting, especially repetitive heavy lifting. If you must do heavy lifting:  Stretch before lifting.  Work slowly.  Rest between lifts.  Use a tool such as a cart or a dolly to move objects if one is available.  Make several small trips instead of carrying one heavy load.  Ask for help when you need it, especially when moving big objects.  Follow these steps when lifting:  Stand with your feet shoulder-width apart.  Get as close to the object as you can. Do not try to pick up a heavy object that is far from your body.  Use handles or lifting straps if they are available.  Bend at your knees. Squat down, but keep your heels off the floor.  Keep your shoulders pulled back, your chin tucked in, and your back straight. °¨ Lift the object slowly while you tighten the muscles in your legs, abdomen, and buttocks. Keep the object as close to the center of your body as possible. °· Follow these steps when putting down a heavy load: °¨ Stand with your feet shoulder-width apart. °¨ Lower the object slowly while you tighten the muscles in your legs, abdomen, and buttocks. Keep the object as close to the center of your body as possible. °¨ Keep your shoulders pulled back, your chin tucked in, and your back straight. °¨ Bend at your knees. Squat  down, but keep your heels off the floor. °¨ Use handles or lifting straps if they are available. °Twisting and Reaching °· Avoid lifting heavy objects above your waist. °· Do not twist at your waist while you are lifting or carrying a load. If you need to turn, move your feet. °· Do not bend over without bending at your knees. °· Avoid reaching over your head, across a table, or for an object on a high surface. °WHAT ARE SOME OTHER TIPS? °· Avoid wet floors and icy ground. Keep sidewalks clear of ice to prevent falls. °· Do not sleep on a mattress that is too soft or too hard. °· Keep items that are used frequently within easy reach. °· Put heavier objects on shelves at waist level, and put lighter objects on lower or higher shelves. °· Find ways to decrease your stress, such as exercise, massage, or relaxation techniques. Stress can build up in your muscles. Tense muscles are more vulnerable to injury. °· Talk with your health care provider if you feel anxious or depressed. These conditions can make back pain worse. °· Wear flat heel shoes with cushioned soles. °· Avoid sudden movements. °· Use both shoulder straps when carrying a backpack. °· Do not use any tobacco products, including cigarettes, chewing tobacco, or electronic cigarettes. If you need help quitting, ask your health care provider. °  °This information is not intended to replace advice given to you by your health care provider. Make sure you discuss any questions you have with your health care provider. °  °Document Released: 11/07/2004 Document Revised: 02/14/2015 Document Reviewed: 10/04/2014 °Elsevier Interactive Patient Education ©2016 [employer] ° °Cryotherapy °Cryotherapy means treatment with cold. Ice or gel packs can be used to reduce both pain and swelling. Ice is the most helpful within the first 24 to 48 hours after an injury or flare-up from overusing a muscle or joint. Sprains, strains, spasms, burning pain, shooting pain, and aches  can all be eased with ice. Ice can also be used when recovering from surgery. Ice is effective, has very few side effects, and is safe for most people to use. °PRECAUTIONS  °Ice is not a safe treatment option for people with: °· Raynaud phenomenon. This is a condition affecting small blood vessels in the extremities. Exposure to cold may cause your problems to return. °· Cold hypersensitivity. There are many forms of cold hypersensitivity, including: °· Cold urticaria. Red, itchy hives appear on the skin when the tissues begin to warm after being iced. °· Cold erythema. This is a red, itchy rash caused by exposure to cold. °· Cold hemoglobinuria. Red blood cells break down when the tissues begin to warm after being iced. The hemoglobin that carry oxygen are passed into the urine because they cannot combine with blood proteins fast enough. °· Numbness or altered sensitivity in the area being   iced. °If you have any of the following conditions, do not use ice until you have discussed cryotherapy with your caregiver: °· Heart conditions, such as arrhythmia, angina, or chronic heart disease. °· High blood pressure. °· Healing wounds or open skin in the area being iced. °· Current infections. °· Rheumatoid arthritis. °· Poor circulation. °· Diabetes. °Ice slows the blood flow in the region it is applied. This is beneficial when trying to stop inflamed tissues from spreading irritating chemicals to surrounding tissues. However, if you expose your skin to cold temperatures for too long or without the proper protection, you can damage your skin or nerves. Watch for signs of skin damage due to cold. °HOME CARE INSTRUCTIONS °Follow these tips to use ice and cold packs safely. °· Place a dry or damp towel between the ice and skin. A damp towel will cool the skin more quickly, so you may need to shorten the time that the ice is used. °· For a more rapid response, add gentle compression to the ice. °· Ice for no more than 10 to  20 minutes at a time. The bonier the area you are icing, the less time it will take to get the benefits of ice. °· Check your skin after 5 minutes to make sure there are no signs of a poor response to cold or skin damage. °· Rest 20 minutes or more between uses. °· Once your skin is numb, you can end your treatment. You can test numbness by very lightly touching your skin. The touch should be so light that you do not see the skin dimple from the pressure of your fingertip. When using ice, most people will feel these normal sensations in this order: cold, burning, aching, and numbness. °· Do not use ice on someone who cannot communicate their responses to pain, such as small children or people with dementia. °HOW TO MAKE AN ICE PACK °Ice packs are the most common way to use ice therapy. Other methods include ice massage, ice baths, and cryosprays. Muscle creams that cause a cold, tingly feeling do not offer the same benefits that ice offers and should not be used as a substitute unless recommended by your caregiver. °To make an ice pack, do one of the following: °· Place crushed ice or a bag of frozen vegetables in a sealable plastic bag. Squeeze out the excess air. Place this bag inside another plastic bag. Slide the bag into a pillowcase or place a damp towel between your skin and the bag. °· Mix 3 parts water with 1 part rubbing alcohol. Freeze the mixture in a sealable plastic bag. When you remove the mixture from the freezer, it will be slushy. Squeeze out the excess air. Place this bag inside another plastic bag. Slide the bag into a pillowcase or place a damp towel between your skin and the bag. °SEEK MEDICAL CARE IF: °· You develop white spots on your skin. This may give the skin a blotchy (mottled) appearance. °· Your skin turns blue or pale. °· Your skin becomes waxy or hard. °· Your swelling gets worse. °MAKE SURE YOU:  °· Understand these instructions. °· Will watch your condition. °· Will get help right  away if you are not doing well or get worse. °  °This information is not intended to replace advice given to you by your health care provider. Make sure you discuss any questions you have with your health care provider. °  °Document Released: 05/27/2011 Document Revised: 10/21/2014 Document   Reviewed: 05/27/2011 Elsevier Interactive Patient Education 2016 [employer]  Knee Injection A knee injection is a procedure to get medicine into your knee joint. Your health care provider puts a needle into the joint and injects medicine with an attached syringe. The injected medicine may relieve the pain, swelling, and stiffness of arthritis. The injected medicine may also help to lubricate and cushion your knee joint. You may need more than one injection. LET [facility] CARE PROVIDER KNOW ABOUT:  Any allergies you have.  All medicines you are taking, including vitamins, herbs, eye drops, creams, and over-the-counter medicines.  Previous problems you or members of your family have had with the use of anesthetics.  Any blood disorders you have.  Previous surgeries you have had.  Any medical conditions you may have. RISKS AND COMPLICATIONS Generally, this is a safe procedure. However, problems may occur, including:  Infection.  Bleeding.  Worsening symptoms.  Damage to the area around your knee.  Allergic reaction to any of the medicines.  Skin reactions from repeated injections. BEFORE THE PROCEDURE  Ask your health care provider about changing or stopping your regular medicines. This is especially important if you are taking diabetes medicines or blood thinners.  Plan to have someone take you home after the procedure. PROCEDURE  You will sit or lie down in a position for your knee to be treated.  The skin over your kneecap will be cleaned with a germ-killing solution (antiseptic).  You will be given a medicine that numbs the area (local anesthetic). You may feel some  stinging.  After your knee becomes numb, you will have a second injection. This is the medicine. This needle is carefully placed between your kneecap and your knee. The medicine is injected into the joint space.  At the end of the procedure, the needle will be removed.  A bandage (dressing) may be placed over the injection site. The procedure may vary among health care providers and hospitals. AFTER THE PROCEDURE  You may have to move your knee through its full range of motion. This helps to get all of the medicine into your joint space.  Your blood pressure, heart rate, breathing rate, and blood oxygen level will be monitored often until the medicines you were given have worn off.  You will be watched to make sure that you do not have a reaction to the injected medicine.   This information is not intended to replace advice given to you by your health care provider. Make sure you discuss any questions you have with your health care provider.   Document Released: 12/22/2006 Document Revised: 10/21/2014 Document Reviewed: 08/10/2014 Elsevier Interactive Patient Education 2016 [employer]  Knee Pain Knee pain is a very common symptom and can have many causes. Knee pain often goes away when you follow your health care provider's instructions for relieving pain and discomfort at home. However, knee pain can develop into a condition that needs treatment. Some conditions may include:  Arthritis caused by wear and tear (osteoarthritis).  Arthritis caused by swelling and irritation (rheumatoid arthritis or gout).  A cyst or growth in your knee.  An infection in your knee joint.  An injury that will not heal.  Damage, swelling, or irritation of the tissues that support your knee (torn ligaments or tendinitis). If your knee pain continues, additional tests may be ordered to diagnose your condition. Tests may include X-rays or other imaging studies of your knee. You may also need to have  fluid removed  from your knee. Treatment for ongoing knee pain depends on the cause, but treatment may include:  Medicines to relieve pain or swelling.  Steroid injections in your knee.  Physical therapy.  Surgery. HOME CARE INSTRUCTIONS  Take medicines only as directed by your health care provider.  Rest your knee and keep it raised (elevated) while you are resting.  Do not do things that cause or worsen pain.  Avoid high-impact activities or exercises, such as running, jumping rope, or doing jumping jacks.  Apply ice to the knee area:  Put ice in a plastic bag.  Place a towel between your skin and the bag.  Leave the ice on for 20 minutes, 2-3 times a day.  Ask your health care provider if you should wear an elastic knee support.  Keep a pillow under your knee when you sleep.  Lose weight if you are overweight. Extra weight can put pressure on your knee.  Do not use any tobacco products, including cigarettes, chewing tobacco, or electronic cigarettes. If you need help quitting, ask your health care provider. Smoking may slow the healing of any bone and joint problems that you may have. SEEK MEDICAL CARE IF:  Your knee pain continues, changes, or gets worse.  You have a fever along with knee pain.  Your knee buckles or locks up.  Your knee becomes more swollen. SEEK IMMEDIATE MEDICAL CARE IF:   Your knee joint feels hot to the touch.  You have chest pain or trouble breathing.   This information is not intended to replace advice given to you by your health care provider. Make sure you discuss any questions you have with your health care provider.   Document Released: 07/28/2007 Document Revised: 10/21/2014 Document Reviewed: 05/16/2014 Elsevier Interactive Patient Education 2016 [place] Strain With Rehab A strain is an injury in which a tendon or muscle is torn. The muscles and tendons of the lower back are vulnerable to strains. However, these  muscles and tendons are very strong and require a great force to be injured. Strains are classified into three categories. Grade 1 strains cause pain, but the tendon is not lengthened. Grade 2 strains include a lengthened ligament, due to the ligament being stretched or partially ruptured. With grade 2 strains there is still function, although the function may be decreased. Grade 3 strains involve a complete tear of the tendon or muscle, and function is usually impaired. SYMPTOMS   Pain in the lower back.  Pain that affects one side more than the other.  Pain that gets worse with movement and may be felt in the hip, buttocks, or back of the thigh.  Muscle spasms of the muscles in the back.  Swelling along the muscles of the back.  Loss of strength of the back muscles.  Crackling sound (crepitation) when the muscles are touched. CAUSES  Lower back strains occur when a force is placed on the muscles or tendons that is greater than they can handle. Common causes of injury include:  Prolonged overuse of the muscle-tendon units in the lower back, usually from incorrect posture.  A single violent injury or force applied to the back. RISK INCREASES WITH:  Sports that involve twisting forces on the spine or a lot of bending at the waist (football, rugby, weightlifting, bowling, golf, tennis, speed skating, racquetball, swimming, running, gymnastics, diving).  Poor strength and flexibility.  Failure to warm up properly before activity.  Family history of lower back pain or disk  disorders.  Previous back injury or surgery (especially fusion).  Poor posture with lifting, especially heavy objects.  Prolonged sitting, especially with poor posture. PREVENTION   Learn and use proper posture when sitting or lifting (maintain proper posture when sitting, lift using the knees and legs, not at the waist).  Warm up and stretch properly before activity.  Allow for adequate recovery between  workouts.  Maintain physical fitness:  Strength, flexibility, and endurance.  Cardiovascular fitness. PROGNOSIS  If treated properly, lower back strains usually heal within 6 weeks. RELATED COMPLICATIONS   Recurring symptoms, resulting in a chronic problem.  Chronic inflammation, scarring, and partial muscle-tendon tear.  Delayed healing or resolution of symptoms.  Prolonged disability. TREATMENT  Treatment first involves the use of ice and medicine, to reduce pain and inflammation. The use of strengthening and stretching exercises may help reduce pain with activity. These exercises may be performed at home or with a therapist. Severe injuries may require referral to a therapist for further evaluation and treatment, such as ultrasound. Your caregiver may advise that you wear a back brace or corset, to help reduce pain and discomfort. Often, prolonged bed rest results in greater harm then benefit. Corticosteroid injections may be recommended. However, these should be reserved for the most serious cases. It is important to avoid using your back when lifting objects. At night, sleep on your back on a firm mattress with a pillow placed under your knees. If non-surgical treatment is unsuccessful, surgery may be needed.  MEDICATION   If pain medicine is needed, nonsteroidal anti-inflammatory medicines (aspirin and ibuprofen), or other minor pain relievers (acetaminophen), are often advised.  Do not take pain medicine for 7 days before surgery.  Prescription pain relievers may be given, if your caregiver thinks they are needed. Use only as directed and only as much as you need.  Ointments applied to the skin may be helpful.  Corticosteroid injections may be given by your caregiver. These injections should be reserved for the most serious cases, because they may only be given a certain number of times. HEAT AND COLD  Cold treatment (icing) should be applied for 10 to 15 minutes every 2 to 3  hours for inflammation and pain, and immediately after activity that aggravates your symptoms. Use ice packs or an ice massage.  Heat treatment may be used before performing stretching and strengthening activities prescribed by your caregiver, physical therapist, or athletic trainer. Use a heat pack or a warm water soak. SEEK MEDICAL CARE IF:   Symptoms get worse or do not improve in 2 to 4 weeks, despite treatment.  You develop numbness, weakness, or loss of bowel or bladder function.  New, unexplained symptoms develop. (Drugs used in treatment may produce side effects.) EXERCISES  RANGE OF MOTION (ROM) AND STRETCHING EXERCISES - Low Back Strain Most people with lower back pain will find that their symptoms get worse with excessive bending forward (flexion) or arching at the lower back (extension). The exercises which will help resolve your symptoms will focus on the opposite motion.  Your physician, physical therapist or athletic trainer will help you determine which exercises will be most helpful to resolve your lower back pain. Do not complete any exercises without first consulting with your caregiver. Discontinue any exercises which make your symptoms worse until you speak to your caregiver.  If you have pain, numbness or tingling which travels down into your buttocks, leg or foot, the goal of the therapy is for these symptoms to move  closer to your back and eventually resolve. Sometimes, these leg symptoms will get better, but your lower back pain may worsen. This is typically an indication of progress in your rehabilitation. Be very alert to any changes in your symptoms and the activities in which you participated in the 24 hours prior to the change. Sharing this information with your caregiver will allow him/her to most efficiently treat your condition.  These exercises may help you when beginning to rehabilitate your injury. Your symptoms may resolve with or without further involvement from  your physician, physical therapist or athletic trainer. While completing these exercises, remember:  Restoring tissue flexibility helps normal motion to return to the joints. This allows healthier, less painful movement and activity.  An effective stretch should be held for at least 30 seconds.  A stretch should never be painful. You should only feel a gentle lengthening or release in the stretched tissue. FLEXION RANGE OF MOTION AND STRETCHING EXERCISES: STRETCH - Flexion, Single Knee to Chest   Lie on a firm bed or floor with both legs extended in front of you.  Keeping one leg in contact with the floor, bring your opposite knee to your chest. Hold your leg in place by either grabbing behind your thigh or at your knee.  Pull until you feel a gentle stretch in your lower back. Hold __________ seconds.  Slowly release your grasp and repeat the exercise with the opposite side. Repeat __________ times. Complete this exercise __________ times per day.  STRETCH - Flexion, Double Knee to Chest   Lie on a firm bed or floor with both legs extended in front of you.  Keeping one leg in contact with the floor, bring your opposite knee to your chest.  Tense your stomach muscles to support your back and then lift your other knee to your chest. Hold your legs in place by either grabbing behind your thighs or at your knees.  Pull both knees toward your chest until you feel a gentle stretch in your lower back. Hold __________ seconds.  Tense your stomach muscles and slowly return one leg at a time to the floor. Repeat __________ times. Complete this exercise __________ times per day.  STRETCH - Low Trunk Rotation  Lie on a firm bed or floor. Keeping your legs in front of you, bend your knees so they are both pointed toward the ceiling and your feet are flat on the floor.  Extend your arms out to the side. This will stabilize your upper body by keeping your shoulders in contact with the  floor.  Gently and slowly drop both knees together to one side until you feel a gentle stretch in your lower back. Hold for __________ seconds.  Tense your stomach muscles to support your lower back as you bring your knees back to the starting position. Repeat the exercise to the other side. Repeat __________ times. Complete this exercise __________ times per day  EXTENSION RANGE OF MOTION AND FLEXIBILITY EXERCISES: STRETCH - Extension, Prone on Elbows   Lie on your stomach on the floor, a bed will be too soft. Place your palms about shoulder width apart and at the height of your head.  Place your elbows under your shoulders. If this is too painful, stack pillows under your chest.  Allow your body to relax so that your hips drop lower and make contact more completely with the floor.  Hold this position for __________ seconds.  Slowly return to lying flat on the floor. Repeat  __________ times. Complete this exercise __________ times per day.  RANGE OF MOTION - Extension, Prone Press Ups  Lie on your stomach on the floor, a bed will be too soft. Place your palms about shoulder width apart and at the height of your head.  Keeping your back as relaxed as possible, slowly straighten your elbows while keeping your hips on the floor. You may adjust the placement of your hands to maximize your comfort. As you gain motion, your hands will come more underneath your shoulders.  Hold this position __________ seconds.  Slowly return to lying flat on the floor. Repeat __________ times. Complete this exercise __________ times per day.  RANGE OF MOTION- Quadruped, Neutral Spine   Assume a hands and knees position on a firm surface. Keep your hands under your shoulders and your knees under your hips. You may place padding under your knees for comfort.  Drop your head and point your tail bone toward the ground below you. This will round out your lower back like an angry cat. Hold this position for  __________ seconds.  Slowly lift your head and release your tail bone so that your back sags into a large arch, like an old horse.  Hold this position for __________ seconds.  Repeat this until you feel limber in your lower back.  Now, find your "sweet spot." This will be the most comfortable position somewhere between the two previous positions. This is your neutral spine. Once you have found this position, tense your stomach muscles to support your lower back.  Hold this position for __________ seconds. Repeat __________ times. Complete this exercise __________ times per day.  STRENGTHENING EXERCISES - Low Back Strain These exercises may help you when beginning to rehabilitate your injury. These exercises should be done near your "sweet spot." This is the neutral, low-back arch, somewhere between fully rounded and fully arched, that is your least painful position. When performed in this safe range of motion, these exercises can be used for people who have either a flexion or extension based injury. These exercises may resolve your symptoms with or without further involvement from your physician, physical therapist or athletic trainer. While completing these exercises, remember:   Muscles can gain both the endurance and the strength needed for everyday activities through controlled exercises.  Complete these exercises as instructed by your physician, physical therapist or athletic trainer. Increase the resistance and repetitions only as guided.  You may experience muscle soreness or fatigue, but the pain or discomfort you are trying to eliminate should never worsen during these exercises. If this pain does worsen, stop and make certain you are following the directions exactly. If the pain is still present after adjustments, discontinue the exercise until you can discuss the trouble with your caregiver. STRENGTHENING - Deep Abdominals, Pelvic Tilt  Lie on a firm bed or floor. Keeping your legs  in front of you, bend your knees so they are both pointed toward the ceiling and your feet are flat on the floor.  Tense your lower abdominal muscles to press your lower back into the floor. This motion will rotate your pelvis so that your tail bone is scooping upwards rather than pointing at your feet or into the floor.  With a gentle tension and even breathing, hold this position for __________ seconds. Repeat __________ times. Complete this exercise __________ times per day.  STRENGTHENING - Abdominals, Crunches   Lie on a firm bed or floor. Keeping your legs in front of you,  bend your knees so they are both pointed toward the ceiling and your feet are flat on the floor. Cross your arms over your chest.  Slightly tip your chin down without bending your neck.  Tense your abdominals and slowly lift your trunk high enough to just clear your shoulder blades. Lifting higher can put excessive stress on the lower back and does not further strengthen your abdominal muscles.  Control your return to the starting position. Repeat __________ times. Complete this exercise __________ times per day.  STRENGTHENING - Quadruped, Opposite UE/LE Lift   Assume a hands and knees position on a firm surface. Keep your hands under your shoulders and your knees under your hips. You may place padding under your knees for comfort.  Find your neutral spine and gently tense your abdominal muscles so that you can maintain this position. Your shoulders and hips should form a rectangle that is parallel with the floor and is not twisted.  Keeping your trunk steady, lift your right hand no higher than your shoulder and then your left leg no higher than your hip. Make sure you are not holding your breath. Hold this position __________ seconds.  Continuing to keep your abdominal muscles tense and your back steady, slowly return to your starting position. Repeat with the opposite arm and leg. Repeat __________ times.  Complete this exercise __________ times per day.  STRENGTHENING - Lower Abdominals, Double Knee Lift  Lie on a firm bed or floor. Keeping your legs in front of you, bend your knees so they are both pointed toward the ceiling and your feet are flat on the floor.  Tense your abdominal muscles to brace your lower back and slowly lift both of your knees until they come over your hips. Be certain not to hold your breath.  Hold __________ seconds. Using your abdominal muscles, return to the starting position in a slow and controlled manner. Repeat __________ times. Complete this exercise __________ times per day.  POSTURE AND BODY MECHANICS CONSIDERATIONS - Low Back Strain Keeping correct posture when sitting, standing or completing your activities will reduce the stress put on different body tissues, allowing injured tissues a chance to heal and limiting painful experiences. The following are general guidelines for improved posture. Your physician or physical therapist will provide you with any instructions specific to your needs. While reading these guidelines, remember:  The exercises prescribed by your provider will help you have the flexibility and strength to maintain correct postures.  The correct posture provides the best environment for your joints to work. All of your joints have less wear and tear when properly supported by a spine with good posture. This means you will experience a healthier, less painful body.  Correct posture must be practiced with all of your activities, especially prolonged sitting and standing. Correct posture is as important when doing repetitive low-stress activities (typing) as it is when doing a single heavy-load activity (lifting). RESTING POSITIONS Consider which positions are most painful for you when choosing a resting position. If you have pain with flexion-based activities (sitting, bending, stooping, squatting), choose a position that allows you to rest in a  less flexed posture. You would want to avoid curling into a fetal position on your side. If your pain worsens with extension-based activities (prolonged standing, working overhead), avoid resting in an extended position such as sleeping on your stomach. Most people will find more comfort when they rest with their spine in a more neutral position, neither too rounded nor  too arched. Lying on a non-sagging bed on your side with a pillow between your knees, or on your back with a pillow under your knees will often provide some relief. Keep in mind, being in any one position for a prolonged period of time, no matter how correct your posture, can still lead to stiffness. PROPER SITTING POSTURE In order to minimize stress and discomfort on your spine, you must sit with correct posture. Sitting with good posture should be effortless for a healthy body. Returning to good posture is a gradual process. Many people can work toward this most comfortably by using various supports until they have the flexibility and strength to maintain this posture on their own. When sitting with proper posture, your ears will fall over your shoulders and your shoulders will fall over your hips. You should use the back of the chair to support your upper back. Your lower back will be in a neutral position, just slightly arched. You may place a small pillow or folded towel at the base of your lower back for support.  When working at a desk, create an environment that supports good, upright posture. Without extra support, muscles tire, which leads to excessive strain on joints and other tissues. Keep these recommendations in mind: CHAIR:  A chair should be able to slide under your desk when your back makes contact with the back of the chair. This allows you to work closely.  The chair's height should allow your eyes to be level with the upper part of your monitor and your hands to be slightly lower than your elbows. BODY POSITION  Your  feet should make contact with the floor. If this is not possible, use a foot rest.  Keep your ears over your shoulders. This will reduce stress on your neck and lower back. INCORRECT SITTING POSTURES  If you are feeling tired and unable to assume a healthy sitting posture, do not slouch or slump. This puts excessive strain on your back tissues, causing more damage and pain. Healthier options include:  Using more support, like a lumbar pillow.  Switching tasks to something that requires you to be upright or walking.  Talking a brief walk.  Lying down to rest in a neutral-spine position. PROLONGED STANDING WHILE SLIGHTLY LEANING FORWARD  When completing a task that requires you to lean forward while standing in one place for a long time, place either foot up on a stationary 2-4 inch high object to help maintain the best posture. When both feet are on the ground, the lower back tends to lose its slight inward curve. If this curve flattens (or becomes too large), then the back and your other joints will experience too much stress, tire more quickly, and can cause pain. CORRECT STANDING POSTURES Proper standing posture should be assumed with all daily activities, even if they only take a few moments, like when brushing your teeth. As in sitting, your ears should fall over your shoulders and your shoulders should fall over your hips. You should keep a slight tension in your abdominal muscles to brace your spine. Your tailbone should point down to the ground, not behind your body, resulting in an over-extended swayback posture.  INCORRECT STANDING POSTURES  Common incorrect standing postures include a forward head, locked knees and/or an excessive swayback. WALKING Walk with an upright posture. Your ears, shoulders and hips should all line-up. PROLONGED ACTIVITY IN A FLEXED POSITION When completing a task that requires you to bend forward at your waist  or lean over a low surface, try to find a way to  stabilize 3 out of 4 of your limbs. You can place a hand or elbow on your thigh or rest a knee on the surface you are reaching across. This will provide you more stability so that your muscles do not fatigue as quickly. By keeping your knees relaxed, or slightly bent, you will also reduce stress across your lower back. CORRECT LIFTING TECHNIQUES DO :   Assume a wide stance. This will provide you more stability and the opportunity to get as close as possible to the object which you are lifting.  Tense your abdominals to brace your spine. Bend at the knees and hips. Keeping your back locked in a neutral-spine position, lift using your leg muscles. Lift with your legs, keeping your back straight.  Test the weight of unknown objects before attempting to lift them.  Try to keep your elbows locked down at your sides in order get the best strength from your shoulders when carrying an object.  Always ask for help when lifting heavy or awkward objects. INCORRECT LIFTING TECHNIQUES DO NOT:   Lock your knees when lifting, even if it is a small object.  Bend and twist. Pivot at your feet or move your feet when needing to change directions.  Assume that you can safely pick up even a paper clip without proper posture.   This information is not intended to replace advice given to you by your health care provider. Make sure you discuss any questions you have with your health care provider.   Emergency Department Resource Guide 1) Find a Doctor and Pay Out of Pocket Although you won't have to find out who is covered by your insurance plan, it is a good idea to ask around and get recommendations. You will then need to call the office and see if the doctor you have chosen will accept you as a new patient and what types of options they offer for patients who are self-pay. Some doctors offer discounts or will set up payment plans for their patients who do not have insurance, but you will need to ask so you  aren't surprised when you get to your appointment.  2) Contact Your Local Health Department Not all health departments have doctors that can see patients for sick visits, but many do, so it is worth a call to see if yours does. If you don't know where your local health department is, you can check in your phone book. The CDC also has a tool to help you locate your state's health department, and many state websites also have listings of all of their local health departments.  3) Find a [facility] If your illness is not likely to be very severe or complicated, you may want to try a walk in clinic. These are popping up all over the country in pharmacies, drugstores, and shopping centers. They're usually staffed by nurse practitioners or physician assistants that have been trained to treat common illnesses and complaints. They're usually fairly quick and inexpensive. However, if you have serious medical issues or chronic medical problems, these are probably not your best option.  No Primary Care Doctor: - Call Health Connect at  [phone] - they can help you locate a primary care doctor that  accepts your insurance, provides certain services, etc. - Physician Referral Service- [phone]  Chronic Pain Problems: Organization         Address  Phone   Notes  [facility]  [phone] Patients need to be referred by their primary care doctor.   Medication Assistance: Organization         Address  Phone   Notes  [facility] Medication [facility] [address] [place], [place] [address] [phone] --Must be a resident of [facility] -- Must have NO insurance coverage whatsoever (no Medicaid/ Medicare, etc.) -- The pt. MUST have a primary care doctor that directs their care regularly and follows them in the community   [facility]ist  [phone]   [employer]  [phone]    Agencies that provide inexpensive medical care: Organization          Address  Phone   Notes  [place]  [phone]   [facility]    [phone]   [facility] [place], [place] [address] [phone]   [place] [address], [place] [phone]   Planned Parenthood    [phone]   [facility]    [phone]   [place] and [place] [address], [place] Phone:  [phone], Fax:  [phone] Hours of Operation:  9 am - 6 pm, M-F.  Also accepts Medicaid/Medicare and self-pay.  [facility] for [address], [address], [place] Phone: [phone], Fax: [phone]. Hours of Operation:  8:30 am - 5:30 pm, M-F.  Also accepts Medicaid and self-pay.  [facility] [place] [address] [place] Phone: [phone]   [place], [place], [place] [phone], Ext. [phone] Mondays & Thursdays: 7-9 AM.  First 15 patients are seen on a first come, first serve basis.    [place] Providers:  Organization         Address  Phone   Notes  [facility] [address], [place] [phone] Also accepts self-pay patients.  [facility] [place] [address], [place]  [phone]   [place], [address], [place] [phone]   [facility] [address] [place] [phone]   Lucianne Lei [address], [place]   [phone] Only accepts [place] Access [place] patients after they have their name applied to their card.   Self-Pay (no insurance) in [facility]:  Organization         Address  Phone   Notes  Sickle Cell Patients, [facility] [place] [phone]   [facility] [place] [phone]    [facility] [place]  [place], [place], [place] [phone]   [facility]/Dr. Osei-Bonsu  [address] [place] or [address], [place] [phone] Phone number for both [place] and [place] locations is the same.  Urgent Medical and [facility] [address] [place] [phone]   [facility] [address] [place] or [address] [phone]   [facility] [address] [place] [phone], phone; [phone], fax Sees patients 1st and 3rd Saturday of every month.  Must not qualify for public or private insurance (i.e. Medicaid,  Medicare, [place] Health Choice, Veterans' Benefits)  Household income should be no more than 200% of the poverty level The clinic cannot treat you if you are pregnant or think you are pregnant  Sexually transmitted diseases are not treated at the clinic.    Dental Care: Organization         Address  Phone  Notes  [facility] [place] [phone] Accepts children up to age 82 who are enrolled in [place] or [place]; pregnant women with a Medicaid card; and children who have applied for Medicaid or [place] Health Choice, but were declined, whose parents can pay a reduced fee at time of service.  [facility]  [address], [place] [phone] Accepts children up to age 73 who are enrolled in [place] or [place]; pregnant women with a Medicaid card; and children who have applied for Medicaid or [place] Health Choice, but were declined, whose parents can pay a reduced fee at time of service.  [place] Adult Dental Access PROGRAM  [place] [phone] Patients are seen by appointment only. Walk-ins are not accepted. [place] will see patients 95  years of age and older. Monday - Tuesday (8am-5pm) Most Wednesdays (8:30-5pm) $30 per visit, cash only  [facility] Adult Dental Access PROGRAM  [address], [facility] [phone] Patients are seen by appointment only. Walk-ins are not accepted. [place] will see patients 13 years of age and older. One Wednesday Evening (Monthly: Volunteer Based).  $30 per visit, cash only  [place]  [phone] for adults; Children under age 34, call Graduate Pediatric Dentistry at [phone]. Children aged 34-14, please call [phone] to request a pediatric application.  Dental services are provided in all areas of dental care including fillings, crowns and bridges, complete and partial dentures, implants, gum treatment, root canals, and extractions. Preventive care is also provided. Treatment is provided to both adults and children. Patients are selected via a lottery and there is often a waiting list.   [facility] [address] [place]  [phone] [email or web address]   [facility] [address] [place], [place] [phone], Ext. [phone] Second and Fourth Thursday of each month, opens at 6:30 AM; Clinic ends at 9 AM.  Patients are seen on a first-come first-served basis, and a limited number are seen during each clinic.   [facility]  [address] Hillard Danker [place], [place] [phone]   Eligibility Requirements You must have lived in [place], [place], or [place] counties for at least the last three months.   You cannot be eligible for state or federal sponsored [employer], including [employer], [place], or [employer].   You generally cannot be eligible for healthcare insurance through your employer.    How to apply: Eligibility screenings are held every Tuesday and Wednesday afternoon from 1:00 pm until 4:00 pm. You do not need an appointment for the interview!  [facility]  [address] [place], [place]   [place]  [place] Department  [place]  [phone]    Behavioral Health Resources in the Community: Intensive Outpatient Programs Organization         Address  Phone  Notes  [place]  [facility] [place] [address] [place], [place] [phone]   [facility] [place] [facility] [address], [place], [place]   ADS: Alcohol [facility] Drug [facility] [address] [place], [place]   [place] [address],  [place], [place] or [phone]   Substance Abuse Resources Organization         Address  Phone  Notes  Alcohol and Drug Services  [phone]   [place]  [phone]   [facility] [place]   Chinita Pester  [phone]   Residential & Outpatient Substance Abuse Program  [phone]   Psychological Services Organization         Address  Phone  Notes  [facility] [place]  [place]  [phone]   [place] [address], [place] or [phone]    Mobile Crisis Teams Organization         Address  Phone  Notes  [facility], Mobile Crisis Care Unit  [phone]   [facility]  [address] [place], [place]   [place] [address] [place] [phone]    Self-Help/Support Groups Organization         Address  Phone             Notes  [place]. of [place] - variety of support groups  [place] Call for more information  Narcotics Anonymous (NA), [facility] [address], [employer] [place]  2 meetings at this location   Special educational needs teacher         Address  Phone  Notes  [facility]idential Treatment [place],    [place]   [phone]   [facility]  [address] [place] [address], [place], [place]   [place] [employer] [place], [place] [phone] Admissions: 8am-3pm M-F  Incentives Substance [place] [address]    [place], [place] [phone]   [facility] [address] [place], [place], [place]   [facility] [address]  [place], [place]   [facility] - Intensive Outpatient [address] Kristeen [address], [place], [place]   [facility] ([place] [place].,  [place], [place] [phone] or [phone]   [facility] Services (RTS) [address], [place], [place] Accepts Medicaid  [facility] [place] [address]  [place] [phone] Substance Abuse/Addiction Treatment   [facility] Organization         Address  Phone  Notes  [facility]  [phone]   Domenic Schwab, PhD [address] Arlis [place], [place]   [phone] or [phone]   [place], [place] [phone]   [facility] [address], [place], [place] [phone] Insurance/Medicaid/sponsorship through [facility] and Families [address]                                    [place], [place] [phone] [place] [address], [place] [phone]    Dr. Adele Schilder  [phone]   [facility] Dept. 1) [address], [place] 2) [place] 3)  [place] [address], [place] [phone]  [phone]   [place] [phone] or [phone] (After Hours)         Follow up with orthopedics for consultation and re-evaluation. Keep leg elevated. Apply ice to affected area. Take naproxen  as needed for pain and inflammation. Return to the ED if you experience severe worsening of your symptoms, numbness/tingling in both extremities, bowel/bladder incontinence.

## 2016-01-13 ENCOUNTER — Emergency Department (HOSPITAL_COMMUNITY): Payer: No Typology Code available for payment source

## 2016-01-13 ENCOUNTER — Encounter (HOSPITAL_COMMUNITY): Payer: Self-pay | Admitting: Emergency Medicine

## 2016-01-13 ENCOUNTER — Emergency Department (HOSPITAL_COMMUNITY)
Admission: EM | Admit: 2016-01-13 | Discharge: 2016-01-14 | Disposition: A | Discharge status: Home / Self Care | Payer: No Typology Code available for payment source | Attending: Emergency Medicine | Admitting: Emergency Medicine

## 2016-01-13 DIAGNOSIS — S99922A Unspecified injury of left foot, initial encounter: Secondary | ICD-10-CM | POA: Insufficient documentation

## 2016-01-13 DIAGNOSIS — Y9289 Other specified places as the place of occurrence of the external cause: Secondary | ICD-10-CM | POA: Insufficient documentation

## 2016-01-13 DIAGNOSIS — Y9389 Activity, other specified: Secondary | ICD-10-CM | POA: Insufficient documentation

## 2016-01-13 DIAGNOSIS — Z791 Long term (current) use of non-steroidal anti-inflammatories (NSAID): Secondary | ICD-10-CM | POA: Insufficient documentation

## 2016-01-13 DIAGNOSIS — W292XXA Contact with other powered household machinery, initial encounter: Secondary | ICD-10-CM | POA: Insufficient documentation

## 2016-01-13 DIAGNOSIS — Y998 Other external cause status: Secondary | ICD-10-CM | POA: Insufficient documentation

## 2016-01-13 DIAGNOSIS — F17210 Nicotine dependence, cigarettes, uncomplicated: Secondary | ICD-10-CM | POA: Insufficient documentation

## 2016-01-13 NOTE — ED Notes (Addendum)
Pt c/o L great toe pain, pt states she was moving a washer and it fell on her foot. Discoloration noted

## 2016-01-14 ENCOUNTER — Emergency Department (HOSPITAL_COMMUNITY): Payer: No Typology Code available for payment source

## 2016-01-14 MED ORDER — OXYCODONE-ACETAMINOPHEN 5-325 MG PO TABS
1.0000 | ORAL_TABLET | Freq: Once | ORAL | Status: AC
  Administered 2016-01-14: 1 via ORAL
  Filled 2016-01-14: qty 1

## 2016-01-14 MED ORDER — OXYCODONE-ACETAMINOPHEN 5-325 MG PO TABS: 1.0000 | ORAL_TABLET | Freq: Three times a day (TID) | ORAL | Status: DC | PRN

## 2016-01-14 NOTE — ED Provider Notes (Signed)
CSN: [id number]     Arrival date & time 01/13/16  2225 History   First MD Initiated Contact with Patient 01/14/16 0001     Chief Complaint  Patient presents with  . Toe Injury   (Consider location/radiation/quality/duration/timing/severity/associated sxs/prior Treatment) HPI 55 y.o. female presents to the Emergency Department today complaining of left great toe pain since 11am this morning. States that she was moving a washing machine until she dropped it onto her foot. Noted immediate pain. States pain is 8/10 and feels like a dull throbbing. Has discoloration. Able to ambulate, but with pain. Tried OTC ibuprofen with minimal relief. No N/V. No other symptoms noted    History reviewed. No pertinent past medical history. Past Surgical History  Procedure Laterality Date  . Laparoscopic salpingo oopherectomy    . Ablation     Family History  Problem Relation Age of Onset  . Hypertension Mother   . Hypertension Father   . Diabetes Father    Social History  Substance Use Topics  . Smoking status: Current Every Day Smoker -- 1.00 packs/day    Types: Cigarettes  . Smokeless tobacco: None  . Alcohol Use: Yes     Comment: occasional   OB History    No data available     Review of Systems ROS reviewed and all are negative for acute change except as noted in the HPI.  Allergies  Review of patient's allergies indicates no known allergies.  Home Medications   Prior to Admission medications   Medication Sig Start Date End Date Taking? Authorizing Provider  diclofenac (VOLTAREN) 75 MG EC tablet Take 1 tablet (75 mg total) by mouth 2 (two) times daily as needed. Patient not taking: Reported on 10/20/2015 12/22/14   Rodolph BongEvan S Corey, MD  diphenhydrAMINE (BENADRYL) 25 MG tablet Take 1 tablet (25 mg total) by mouth every 6 (six) hours. Patient not taking: Reported on 10/20/2015 05/12/15   Catha GosselinHanna Patel-Mills, PA-C  HYDROcodone-acetaminophen (NORCO/VICODIN) 5-325 MG tablet Take 2 tablets by mouth  every 4 (four) hours as needed. 10/20/15   Samantha Tripp Dowless, PA-C  hydrocortisone cream 1 % Apply to affected area 2 times daily Patient not taking: Reported on 10/20/2015 05/12/15   Catha GosselinHanna Patel-Mills, PA-C  ibuprofen (ADVIL,MOTRIN) 200 MG tablet Take 800 mg by mouth every 6 (six) hours as needed (Pain).    Historical Provider, MD  naproxen (NAPROSYN) 500 MG tablet Take 1 tablet (500 mg total) by mouth 2 (two) times daily. 10/20/15   Samantha Tripp Dowless, PA-C   BP 139/52 mmHg  Pulse 61  Temp(Src) 97.6 F (36.4 C) (Oral)  Resp 20  SpO2 97%   Physical Exam  Constitutional: She is oriented to person, place, and time. She appears well-developed and well-nourished.  HENT:  Head: Normocephalic and atraumatic.  Eyes: EOM are normal.  Neck: Normal range of motion.  Cardiovascular: Normal rate and regular rhythm.   Pulmonary/Chest: Effort normal.  Abdominal: Soft.  Musculoskeletal: Normal range of motion.  Left great toe TTP along DIP. Discoloration noted. Cap refill <sec. Minimal ROM with pain.   Neurological: She is alert and oriented to person, place, and time.  Skin: Skin is warm and dry.  Psychiatric: She has a normal mood and affect. Her behavior is normal. Thought content normal.  Nursing note and vitals reviewed.  ED Course  Procedures (including critical care time) Labs Review Labs Reviewed - No data to display  Imaging Review Dg Toe Great Left  01/14/2016  CLINICAL DATA:  Left  great toe pain and bruising, after dropping washing machine on left great toe. Initial encounter. EXAM: LEFT GREAT TOE COMPARISON:  None. FINDINGS: There is no evidence of fracture or dislocation. The left great toe appears grossly intact. No definite soft tissue abnormalities are characterized on radiograph. Visualized joint spaces are preserved. IMPRESSION: No evidence of fracture or dislocation. Electronically Signed   By: Roanna Raider M.D.   On: 01/14/2016 01:02   I have personally reviewed and  evaluated these images and lab results as part of my medical decision-making.   EKG Interpretation None      MDM  I have reviewed and evaluated the relevant imaging studies. I have reviewed the relevant previous healthcare records. I obtained HPI from historian. Patient discussed with supervising physician  ED Course:  Assessment: Pt is a 54yF who presents with left great toe pain since 11am after washer dropped on her foot. Able to ambualte with pain. On exam, pt in NAD. Nontoxic/nonseptic appearing. VSS. Afebrile. Left great toe DIP TTP with minimal ecchymosis noted. Minimal ROM due to pain XR shows no acute fracture. Given analgesia in ED. Plan is to DC Home with follow up to PCP for further mangement. At time of discharge, Patient is in no acute distress. Vital Signs are stable. Patient is able to ambulate. Patient able to tolerate PO.    Disposition/Plan:  Dc HOme Additional Verbal discharge instructions given and discussed with patient.  Pt Instructed to f/u with PCP in the next 48 hours for evaluation and treatment of symptoms. Return precautions given Pt acknowledges and agrees with plan  Supervising Physician Zadie Rhine, MD   Final diagnoses:  Toe injury, left, initial encounter     Audry Pili, PA-C 01/14/16 4782  Zadie Rhine, MD 01/14/16 [phone]

## 2016-01-14 NOTE — Discharge Instructions (Signed)
Please read and follow all provided instructions.  Your diagnoses today include:  1. Toe injury, left, initial encounter    Tests performed today include:  Vital signs. See below for your results today.   Medications prescribed:   Take as prescribed.  You can use Ibuprofen 400mg  combined with Tylenol 1000mg  for pain relief every 6 hours. Do not exceed 4g of Tylenol in one 24 hour period. Use narcotics if pain uncontrolled with the aforementioned regiment.   Home care instructions:  Follow any educational materials contained in this packet.  Follow-up instructions: Please follow-up with your primary care provider in the next week for further evaluation of symptoms and treatment   Return instructions:   Please return to the Emergency Department if you do not get better, if you get worse, or new symptoms OR  - Fever (temperature greater than 101.40F)  - Bleeding that does not stop with holding pressure to the area    -Severe pain (please note that you may be more sore the day after your accident)  - Chest Pain  - Difficulty breathing  - Severe nausea or vomiting  - Inability to tolerate food and liquids  - Passing out  - Skin becoming red around your wounds  - Change in mental status (confusion or lethargy)  - New numbness or weakness     Please return if you have any other emergent concerns.  Additional Information:  Your vital signs today were: BP 139/52 mmHg   Pulse 61   Temp(Src) 97.6 F (36.4 C) (Oral)   Resp 20   SpO2 97% If your blood pressure (BP) was elevated above 135/85 this visit, please have this repeated by your doctor within one month. ---------------

## 2016-06-25 ENCOUNTER — Emergency Department (HOSPITAL_COMMUNITY)
Admission: EM | Admit: 2016-06-25 | Discharge: 2016-06-25 | Disposition: A | Discharge status: Home / Self Care | Payer: No Typology Code available for payment source | Attending: Emergency Medicine | Admitting: Emergency Medicine

## 2016-06-25 ENCOUNTER — Encounter (HOSPITAL_COMMUNITY): Payer: Self-pay | Admitting: Emergency Medicine

## 2016-06-25 ENCOUNTER — Emergency Department (HOSPITAL_COMMUNITY): Payer: No Typology Code available for payment source

## 2016-06-25 DIAGNOSIS — F17210 Nicotine dependence, cigarettes, uncomplicated: Secondary | ICD-10-CM | POA: Insufficient documentation

## 2016-06-25 DIAGNOSIS — J40 Bronchitis, not specified as acute or chronic: Secondary | ICD-10-CM | POA: Insufficient documentation

## 2016-06-25 MED ORDER — PREDNISONE 20 MG PO TABS: ORAL_TABLET | ORAL | 0 refills | Status: DC

## 2016-06-25 MED ORDER — AZITHROMYCIN 250 MG PO TABS: ORAL_TABLET | ORAL | 0 refills | Status: DC

## 2016-06-25 MED ORDER — BENZONATATE 100 MG PO CAPS: 100.0000 mg | ORAL_CAPSULE | Freq: Three times a day (TID) | ORAL | 0 refills | Status: DC

## 2016-06-25 MED ORDER — ALBUTEROL SULFATE HFA 108 (90 BASE) MCG/ACT IN AERS
2.0000 | INHALATION_SPRAY | RESPIRATORY_TRACT | Status: DC | PRN
  Administered 2016-06-25: 2 via RESPIRATORY_TRACT
  Filled 2016-06-25: qty 6.7

## 2016-06-25 NOTE — ED Provider Notes (Signed)
[facility]-EMERGENCY DEPT Provider Note   CSN: [id number] Arrival date & time: 06/25/16  1958     History   Chief Complaint Chief Complaint  Patient presents with  . Shortness of Breath  . Cough    HPI Terri Cameron is a 55 y.o. female.  HPI   55 year old female with hx of tobacco abuse presenting c/o cough and congestions.  She report gradual onset of congestion, cough productive with clear to white sputum along with pleuritic cp and SOB x 2 weeks. Report having runny nose.  Cough is worse at night, keeping her up.  She has been using Mucinex without relief.  Has been use tylenol as needed.  Report post nasal drips.  Report tightness in her chest and trouble breathing.  Denies hx of asthma or COPD.  Does have a 30 pack year hx of tobacco use. Denies fever, chills, cp, diaphoresis, nausea, exertional SOB.  [place] mom was hospitalized for CHF exacerbation.  Did report long trip drive home but denies having leg swelling or calf pain.  No prior hx of PE/DVT, no recent surgery, hormone use, hemoptysis or active cancer.  Denies cardiac hx and denies having chest pain.  Report having chills without fever.  It is actively trying to quit smoking.    History reviewed. No pertinent past medical history.  There are no active problems to display for this patient.   Past Surgical History:  Procedure Laterality Date  . ABLATION    . LAPAROSCOPIC SALPINGO OOPHERECTOMY      OB History    No data available       Home Medications    Prior to Admission medications   Medication Sig Start Date End Date Taking? Authorizing Provider  diclofenac (VOLTAREN) 75 MG EC tablet Take 1 tablet (75 mg total) by mouth 2 (two) times daily as needed. Patient not taking: Reported on 10/20/2015 12/22/14   Rodolph BongEvan S Corey, MD  diphenhydrAMINE (BENADRYL) 25 MG tablet Take 1 tablet (25 mg total) by mouth every 6 (six) hours. Patient not taking: Reported on 10/20/2015  05/12/15   Catha GosselinHanna Patel-Mills, PA-C  HYDROcodone-acetaminophen (NORCO/VICODIN) 5-325 MG tablet Take 2 tablets by mouth every 4 (four) hours as needed. 10/20/15   Samantha Tripp Dowless, PA-C  hydrocortisone cream 1 % Apply to affected area 2 times daily Patient not taking: Reported on 10/20/2015 05/12/15   Catha GosselinHanna Patel-Mills, PA-C  ibuprofen (ADVIL,MOTRIN) 200 MG tablet Take 800 mg by mouth every 6 (six) hours as needed (Pain).    Historical Provider, MD  naproxen (NAPROSYN) 500 MG tablet Take 1 tablet (500 mg total) by mouth 2 (two) times daily. 10/20/15   Samantha Tripp Dowless, PA-C  oxyCODONE-acetaminophen (PERCOCET/ROXICET) 5-325 MG tablet Take 1 tablet by mouth every 8 (eight) hours as needed for severe pain. 01/14/16   Audry Piliyler Mohr, PA-C    Family History Family History  Problem Relation Age of Onset  . Hypertension Mother   . Hypertension Father   . Diabetes Father     Social History Social History  Substance Use Topics  . Smoking status: Current Every Day Smoker    Packs/day: 1.00    Types: Cigarettes  . Smokeless tobacco: Never Used  . Alcohol use Yes     Comment: occasional     Allergies   Review of patient's allergies indicates no known allergies.   Review of Systems Review of Systems  All other systems reviewed and are  negative.    Physical Exam Updated Vital Signs BP 146/65 (BP Location: Left Arm)   Pulse 79   Temp 98.1 F (36.7 C) (Oral)   Resp 20   SpO2 99%   Physical Exam  Constitutional: She appears well-developed and well-nourished. No distress.  HENT:  Head: Atraumatic.  Nose: Nose normal.  Mouth/Throat: Oropharynx is clear and moist.  Eyes: Conjunctivae are normal.  Neck: Neck supple. No JVD present.  Cardiovascular: Normal rate, regular rhythm and intact distal pulses.   Pulmonary/Chest: Effort normal. No respiratory distress. She has wheezes (faint expiratory wheeze with prolonged expiratory phase). She has no rales. She exhibits no tenderness.    Abdominal: Soft. There is no tenderness.  Musculoskeletal: She exhibits no edema.  BLE without palpable cords, erythema, or edema.    Neurological: She is alert.  Skin: Capillary refill takes less than 2 seconds. No rash noted.  Psychiatric: She has a normal mood and affect.  Nursing note and vitals reviewed.    ED Treatments / Results  Labs (all labs ordered are listed, but only abnormal results are displayed) Labs Reviewed - No data to display  EKG  EKG Interpretation None       Radiology Dg Chest 2 View  Result Date: 06/25/2016 CLINICAL DATA:  55 y/o F; productive cough. Shortness of breath for 2 weeks. Initial encounter. EXAM: CHEST  2 VIEW COMPARISON:  None. FINDINGS: Normal cardiomediastinal silhouette. Clear lungs. Mild degenerative changes of the spine. IMPRESSION: No active cardiopulmonary disease. Electronically Signed   By: Mitzi Hansen M.D.   On: 06/25/2016 21:00    Procedures Procedures (including critical care time)  Medications Ordered in ED Medications - No data to display   Initial Impression / Assessment and Plan / ED Course  I have reviewed the triage vital signs and the nursing notes.  Pertinent labs & imaging results that were available during my care of the patient were reviewed by me and considered in my medical decision making (see chart for details).  Clinical Course    BP 146/65 (BP Location: Left Arm)   Pulse 79   Temp 98.1 F (36.7 C) (Oral)   Resp 20   SpO2 99%    Final Clinical Impressions(s) / ED Diagnoses   Final diagnoses:  Bronchitis    New Prescriptions New Prescriptions   AZITHROMYCIN (ZITHROMAX Z-PAK) 250 MG TABLET    2 po day one, then 1 daily x 4 days   BENZONATATE (TESSALON) 100 MG CAPSULE    Take 1 capsule (100 mg total) by mouth every 8 (eight) hours.   PREDNISONE (DELTASONE) 20 MG TABLET    3 tabs po day one, then 2 tabs daily x 4 days   9:46 PM Pt here with cough productive with white/clear  sputum, post nasal drips, chills, and increase congestion and SOB with wheezing.  CXR without acute finding.  Suspect bronchitis vs COPD.  Recent road trip 3 weeks ago, however I have low suspicion for PE causing her sxs.  She is a heavy smoker.  I will provide treatment for bronchitis with prednisone, albuterol rescue inhaler, cough medication and zpak.  Encourage pt to f/u with PCP for further management.  Pt understand to return if sxs worsen.  Pt voice understanding and agrees with plan.     Fayrene Helper, PA-C 06/25/16 2150    Bethann Berkshire, MD 06/25/16 [phone]

## 2016-06-25 NOTE — Discharge Instructions (Signed)
Please avoid smoking as it can worsen your symptoms.  Use number in this discharge paper to find a primary care provider for further management of your health.  Use albuterol inhaler 2 puffs every 4 hours as needed for shortness of breath.  Return if your condition worsen or if you have other concerns.

## 2016-06-25 NOTE — ED Triage Notes (Signed)
Pt states she has had something going on for the past 2 weeks  Pt states she has had a cough and congestion with some shortness of breath  Pt states she wakes at night coughing so bad she has to get up and sit on the cough for a while  Pt states she has clear to white sputum when she gets something up

## 2016-11-05 ENCOUNTER — Encounter (HOSPITAL_COMMUNITY): Payer: Self-pay | Admitting: Emergency Medicine

## 2016-11-05 ENCOUNTER — Emergency Department (HOSPITAL_COMMUNITY)
Admission: EM | Admit: 2016-11-05 | Discharge: 2016-11-05 | Disposition: A | Discharge status: Home / Self Care | Payer: Managed Care, Other (non HMO) | Attending: Emergency Medicine | Admitting: Emergency Medicine

## 2016-11-05 DIAGNOSIS — F17210 Nicotine dependence, cigarettes, uncomplicated: Secondary | ICD-10-CM | POA: Diagnosis not present

## 2016-11-05 DIAGNOSIS — R05 Cough: Secondary | ICD-10-CM | POA: Diagnosis present

## 2016-11-05 DIAGNOSIS — Z79899 Other long term (current) drug therapy: Secondary | ICD-10-CM | POA: Insufficient documentation

## 2016-11-05 DIAGNOSIS — J209 Acute bronchitis, unspecified: Secondary | ICD-10-CM

## 2016-11-05 MED ORDER — AZITHROMYCIN 250 MG PO TABS: ORAL_TABLET | ORAL | 0 refills | Status: DC

## 2016-11-05 MED ORDER — PREDNISONE 20 MG PO TABS: 40.0000 mg | ORAL_TABLET | Freq: Every day | ORAL | 0 refills | Status: DC

## 2016-11-05 MED ORDER — ALBUTEROL SULFATE HFA 108 (90 BASE) MCG/ACT IN AERS
2.0000 | INHALATION_SPRAY | RESPIRATORY_TRACT | Status: DC | PRN
  Filled 2016-11-05: qty 6.7

## 2016-11-05 MED ORDER — BENZONATATE 100 MG PO CAPS: 100.0000 mg | ORAL_CAPSULE | Freq: Three times a day (TID) | ORAL | 0 refills | Status: DC

## 2016-11-05 NOTE — ED Provider Notes (Signed)
[facility]-EMERGENCY DEPT Provider Note   CSN: [id number] Arrival date & time: 11/05/16  1042   By signing my name below, I, Avnee Patel, attest that this documentation has been prepared under the direction and in the presence of  [employer]. Electronically Signed: Clovis Pu, ED Scribe. 11/05/16. 1:57 PM.   History   Chief Complaint Chief Complaint  Patient presents with  . Influenza   The history is provided by the patient. No language interpreter was used.   HPI Comments:  Terri Cameron is a 56 y.o. female who presents to the Emergency Department complaining of productive cough with white phlegm x 1 week. Pt also reports chills, generalized body aches, headaches, chest tightness, resolved nausea and recent sick contacts. She has used Advil, tylenol and nose spray with no relief. Pt denies current nausea, vomiting, diarrhea and any other associated symptoms at this time. She is a smoker.   Per chart review, pt was seen in the ED on 06/25/16, diagnosed with bronchitis and prescribed an albuterol inhaler, azithromycin, and prednisone. Pt reports today's symptoms feel similar to the symptoms she experienced with bronchitis except for the headache and body aches.   History reviewed. No pertinent past medical history.  There are no active problems to display for this patient.   Past Surgical History:  Procedure Laterality Date  . ABLATION    . LAPAROSCOPIC SALPINGO OOPHERECTOMY      OB History    No data available       Home Medications    Prior to Admission medications   Medication Sig Start Date End Date Taking? Authorizing Provider  azithromycin (ZITHROMAX Z-PAK) 250 MG tablet 2 po day one, then 1 daily x 4 days 06/25/16   Fayrene Helper, PA-C  benzonatate (TESSALON) 100 MG capsule Take 1 capsule (100 mg total) by mouth every 8 (eight) hours. 06/25/16   Fayrene Helper, PA-C  diclofenac (VOLTAREN) 75 MG EC tablet Take 1 tablet (75 mg total) by mouth 2 (two) times daily as  needed. Patient not taking: Reported on 10/20/2015 12/22/14   Rodolph Bong, MD  diphenhydrAMINE (BENADRYL) 25 MG tablet Take 1 tablet (25 mg total) by mouth every 6 (six) hours. Patient not taking: Reported on 10/20/2015 05/12/15   Catha Gosselin, PA-C  HYDROcodone-acetaminophen (NORCO/VICODIN) 5-325 MG tablet Take 2 tablets by mouth every 4 (four) hours as needed. 10/20/15   Samantha Tripp Dowless, PA-C  hydrocortisone cream 1 % Apply to affected area 2 times daily Patient not taking: Reported on 10/20/2015 05/12/15   Catha Gosselin, PA-C  ibuprofen (ADVIL,MOTRIN) 200 MG tablet Take 800 mg by mouth every 6 (six) hours as needed (Pain).    Historical Provider, MD  naproxen (NAPROSYN) 500 MG tablet Take 1 tablet (500 mg total) by mouth 2 (two) times daily. 10/20/15   Samantha Tripp Dowless, PA-C  oxyCODONE-acetaminophen (PERCOCET/ROXICET) 5-325 MG tablet Take 1 tablet by mouth every 8 (eight) hours as needed for severe pain. 01/14/16   Audry Pili, PA-C  predniSONE (DELTASONE) 20 MG tablet 3 tabs po day one, then 2 tabs daily x 4 days 06/25/16   Fayrene Helper, PA-C    Family History Family History  Problem Relation Age of Onset  . Hypertension Mother   . Hypertension Father   . Diabetes Father     Social History Social History  Substance Use Topics  . Smoking status: Current Every Day Smoker    Packs/day: 1.00    Types: Cigarettes  . Smokeless tobacco: Never Used  .  Alcohol use Yes     Comment: occasional     Allergies   Patient has no known allergies.   Review of Systems Review of Systems  Constitutional: Positive for chills. Negative for fatigue and fever.  HENT: Negative for congestion, ear pain, rhinorrhea, sinus pressure and sore throat.   Eyes: Negative for redness.  Respiratory: Positive for cough and chest tightness. Negative for wheezing.   Gastrointestinal: Positive for nausea. Negative for abdominal pain, diarrhea and vomiting.  Genitourinary: Negative for dysuria.    Musculoskeletal: Positive for myalgias. Negative for neck stiffness.  Skin: Negative for rash.  Neurological: Positive for headaches.  Hematological: Negative for adenopathy.   Physical Exam Updated Vital Signs BP 148/77 (BP Location: Left Arm)   Pulse 62   Temp 98.2 F (36.8 C) (Oral)   Resp 16   SpO2 100%   Physical Exam  Constitutional: She appears well-developed and well-nourished. No distress.  HENT:  Head: Normocephalic and atraumatic.  Right Ear: Tympanic membrane, external ear and ear canal normal.  Left Ear: Tympanic membrane, external ear and ear canal normal.  Nose: Nose normal. No mucosal edema or rhinorrhea.  Mouth/Throat: Uvula is midline, oropharynx is clear and moist and mucous membranes are normal. Mucous membranes are not dry. No oral lesions. No trismus in the jaw. No uvula swelling. No oropharyngeal exudate, posterior oropharyngeal edema, posterior oropharyngeal erythema or tonsillar abscesses.  Eyes: Conjunctivae are normal. Right eye exhibits no discharge. Left eye exhibits no discharge.  Neck: Normal range of motion. Neck supple.  Cardiovascular: Normal rate, regular rhythm and normal heart sounds.   Pulmonary/Chest: Effort normal and breath sounds normal. No respiratory distress. She has no wheezes. She has no rales.  Abdominal: Soft. She exhibits no distension. There is no tenderness.  Lymphadenopathy:    She has no cervical adenopathy.  Neurological: She is alert.  Skin: Skin is warm and dry.  Psychiatric: She has a normal mood and affect.  Nursing note and vitals reviewed.  ED Treatments / Results  DIAGNOSTIC STUDIES:  Oxygen Saturation is 100% on RA, normal by my interpretation.    COORDINATION OF CARE:  1:55 PM Will prescribe antibiotics, cough medication and an inhaler. Discussed treatment plan with pt at bedside and pt agreed to plan.  Procedures Procedures (including critical care time)  Medications Ordered in ED Medications - No data  to display   Initial Impression / Assessment and Plan / ED Course  I have reviewed the triage vital signs and the nursing notes.  Pertinent labs & imaging results that were available during my care of the patient were reviewed by me and considered in my medical decision making (see chart for details).    Patient seen and examined.   Vital signs reviewed and are as follows: BP 141/56   Pulse 65   Temp 98.2 F (36.8 C) (Oral)   Resp 15   SpO2 100%     Final Clinical Impressions(s) / ED Diagnoses   Final diagnoses:  Acute bronchitis, unspecified organism   Patient with likely bronchitis. Treatment as above. Question element of undiagnosed COPD. Given increased sputum production, feel abx reasonable. Influenza on ddx but less likely, no significant fever.      New Prescriptions Discharge Medication List as of 11/05/2016  2:04 PM    I personally performed the services described in this documentation, which was scribed in my presence. The recorded information has been reviewed and is accurate.     Renne CriglerJoshua Samad Thon, PA-C 11/05/16 [phone]  Rolland Porter, MD 11/14/16 [phone]

## 2016-11-05 NOTE — Discharge Instructions (Signed)
Please read and follow all provided instructions.  Your diagnoses today include:  1. Acute bronchitis, unspecified organism     Tests performed today include:  Vital signs. See below for your results today.   Medications prescribed:   Prednisone - steroid medicine   It is best to take this medication in the morning to prevent sleeping problems. If you are diabetic, monitor your blood sugar closely and stop taking Prednisone if blood sugar is over 300. Take with food to prevent stomach upset.    Azithromycin - antibiotic for respiratory infection  You have been prescribed an antibiotic medicine: take the entire course of medicine even if you are feeling better. Stopping early can cause the antibiotic not to work.   Tessalon Perles - cough suppressant medication   Albuterol inhaler - medication that opens up your airway  Use inhaler as follows: 1-2 puffs with spacer every 4 hours as needed for wheezing, cough, or shortness of breath.   Take any prescribed medications only as directed.  Home care instructions:  Follow any educational materials contained in this packet.  Follow-up instructions: Please follow-up with your primary care provider in the next 3 days for further evaluation of your symptoms and a recheck if you are not feeling better.   Return instructions:   Please return to the Emergency Department if you experience worsening symptoms.  Please return with worsening wheezing, shortness of breath, or difficulty breathing.  Return with persistent fever above 101F.   Please return if you have any other emergent concerns.  Additional Information:  Your vital signs today were: BP 148/77 (BP Location: Left Arm)    Pulse 62    Temp 98.2 F (36.8 C) (Oral)    Resp 16    SpO2 100%  If your blood pressure (BP) was elevated above 135/85 this visit, please have this repeated by your doctor within one month. --------------

## 2016-11-05 NOTE — ED Triage Notes (Signed)
Cough  With phlegm white x 1 week and today  She has body aches and h/a  Has taken otc meds not helping,

## 2016-11-17 ENCOUNTER — Encounter (HOSPITAL_COMMUNITY): Payer: Self-pay | Admitting: *Deleted

## 2016-11-17 ENCOUNTER — Emergency Department (HOSPITAL_COMMUNITY): Payer: Managed Care, Other (non HMO)

## 2016-11-17 ENCOUNTER — Emergency Department (HOSPITAL_COMMUNITY)
Admission: EM | Admit: 2016-11-17 | Discharge: 2016-11-18 | Disposition: A | Discharge status: Home / Self Care | Payer: Managed Care, Other (non HMO) | Attending: Emergency Medicine | Admitting: Emergency Medicine

## 2016-11-17 DIAGNOSIS — Z79899 Other long term (current) drug therapy: Secondary | ICD-10-CM | POA: Insufficient documentation

## 2016-11-17 DIAGNOSIS — R0602 Shortness of breath: Secondary | ICD-10-CM | POA: Diagnosis present

## 2016-11-17 DIAGNOSIS — J189 Pneumonia, unspecified organism: Secondary | ICD-10-CM | POA: Insufficient documentation

## 2016-11-17 DIAGNOSIS — J181 Lobar pneumonia, unspecified organism: Secondary | ICD-10-CM

## 2016-11-17 DIAGNOSIS — F17210 Nicotine dependence, cigarettes, uncomplicated: Secondary | ICD-10-CM | POA: Insufficient documentation

## 2016-11-17 LAB — CBC
HCT: 42.7 % (ref 36.0–46.0)
Hemoglobin: 14.5 g/dL (ref 12.0–15.0)
MCH: 30.3 pg (ref 26.0–34.0)
MCHC: 34 g/dL (ref 30.0–36.0)
MCV: 89.3 fL (ref 78.0–100.0)
PLATELETS: 273 10*3/uL (ref 150–400)
RBC: 4.78 MIL/uL (ref 3.87–5.11)
RDW: 14.4 % (ref 11.5–15.5)
WBC: 6.7 10*3/uL (ref 4.0–10.5)

## 2016-11-17 LAB — BASIC METABOLIC PANEL
Anion gap: 13 (ref 5–15)
BUN: 12 mg/dL (ref 6–20)
CHLORIDE: 101 mmol/L (ref 101–111)
CO2: 22 mmol/L (ref 22–32)
CREATININE: 0.68 mg/dL (ref 0.44–1.00)
Calcium: 9.7 mg/dL (ref 8.9–10.3)
GFR calc Af Amer: >60 mL/min (ref >60)
GLUCOSE: 129 mg/dL — AB (ref 65–99)
Potassium: 4.2 mmol/L (ref 3.5–5.1)
SODIUM: 136 mmol/L (ref 135–145)

## 2016-11-17 LAB — D-DIMER, QUANTITATIVE (NOT AT ARMC): D DIMER QUANT: 0.57 ug{FEU}/mL — AB (ref 0.00–0.50)

## 2016-11-17 LAB — I-STAT TROPONIN, ED
Troponin i, poc: 0 ng/mL (ref 0.00–0.08)
Troponin i, poc: 0 ng/mL (ref 0.00–0.08)

## 2016-11-17 MED ORDER — IPRATROPIUM-ALBUTEROL 0.5-2.5 (3) MG/3ML IN SOLN
3.0000 mL | Freq: Once | RESPIRATORY_TRACT | Status: AC
  Administered 2016-11-17: 3 mL via RESPIRATORY_TRACT
  Filled 2016-11-17: qty 3

## 2016-11-17 MED ORDER — IOPAMIDOL (ISOVUE-370) INJECTION 76%
INTRAVENOUS | Status: AC
  Administered 2016-11-17: 66 mL
  Filled 2016-11-17: qty 100

## 2016-11-17 MED ORDER — ACETAMINOPHEN 500 MG PO TABS
1000.0000 mg | ORAL_TABLET | Freq: Once | ORAL | Status: AC
  Administered 2016-11-17: 1000 mg via ORAL
  Filled 2016-11-17: qty 2

## 2016-11-17 NOTE — ED Notes (Signed)
Attempted IV for CT PE x2 without success.  Will consult another RN.

## 2016-11-17 NOTE — ED Notes (Signed)
Patient transported to CT 

## 2016-11-17 NOTE — ED Triage Notes (Signed)
Pt reports mid chest discomfort today that radiates into right shoulder. Having sob. Reports recent cough and diagnosed with bronchitis. Cough is productive with white sputum. No acute resp distress is noted at triage and ekg done.

## 2016-11-17 NOTE — ED Notes (Signed)
I ambulated patient in the hallway.  Initially when patient stands her 02 saturation drops to 89% and then quickly came up to 94%.  While ambulating patient in hallway 02 saturation remained at 94-95%.  Patient did not complain of dizziness.

## 2016-11-18 MED ORDER — AMOXICILLIN-POT CLAVULANATE 875-125 MG PO TABS: 1.0000 | ORAL_TABLET | Freq: Two times a day (BID) | ORAL | 0 refills | Status: DC

## 2016-11-18 MED ORDER — GUAIFENESIN 100 MG/5ML PO LIQD: 100.0000 mg | ORAL | 0 refills | Status: DC | PRN

## 2016-11-18 NOTE — ED Provider Notes (Signed)
[facility]-EMERGENCY DEPT Provider Note   CSN: [id number] Arrival date & time: 11/17/16  1639     History   Chief Complaint Chief Complaint  Patient presents with  . Shortness of Breath  . Chest Pain    HPI Terri Cameron is a 56 y.o. female.  Patient presents to the emergency department with chief complaint of cough, shortness of breath, chest pain. She reports that she has had cough and cold symptoms for the past 2 weeks. She has not been getting any better discuss by taking azithromycin, prednisone, and an inhaler. He denies any fevers or chills. She states that she does have a productive cough. She denies any recent travel, surgery, or history of PE or DVT. There are no other associated symptoms.   The history is provided by the patient. No language interpreter was used.    History reviewed. No pertinent past medical history.  There are no active problems to display for this patient.   Past Surgical History:  Procedure Laterality Date  . ABLATION    . LAPAROSCOPIC SALPINGO OOPHERECTOMY      OB History    No data available       Home Medications    Prior to Admission medications   Medication Sig Start Date End Date Taking? Authorizing Provider  acetaminophen (TYLENOL) 500 MG tablet Take 500-1,000 mg by mouth every 6 (six) hours as needed for headache (or pain).   Yes Historical Provider, MD  amoxicillin-clavulanate (AUGMENTIN) 875-125 MG tablet Take 1 tablet by mouth every 12 (twelve) hours. 11/18/16   Roxy Horsemanobert Brok Stocking, PA-C  azithromycin (ZITHROMAX Z-PAK) 250 MG tablet Take two tablets PO on day 1 and one tablet PO days 2-5 Patient not taking: Reported on 11/17/2016 11/05/16   Renne CriglerJoshua Geiple, PA-C  benzonatate (TESSALON) 100 MG capsule Take 1 capsule (100 mg total) by mouth every 8 (eight) hours. Patient not taking: Reported on 11/17/2016 11/05/16   Renne CriglerJoshua Geiple, PA-C  guaiFENesin (ROBITUSSIN) 100 MG/5ML liquid Take 5-10 mLs (100-200 mg total) by mouth every 4 (four) hours  as needed for cough. 11/18/16   Roxy Horsemanobert Anhad Sheeley, PA-C  predniSONE (DELTASONE) 20 MG tablet Take 2 tablets (40 mg total) by mouth daily. Patient not taking: Reported on 11/17/2016 11/05/16   Renne CriglerJoshua Geiple, PA-C    Family History Family History  Problem Relation Age of Onset  . Hypertension Mother   . Hypertension Father   . Diabetes Father     Social History Social History  Substance Use Topics  . Smoking status: Current Every Day Smoker    Packs/day: 1.00    Types: Cigarettes  . Smokeless tobacco: Never Used  . Alcohol use Yes     Comment: occasional     Allergies   Patient has no known allergies.   Review of Systems Review of Systems  Respiratory: Positive for cough and shortness of breath.   Cardiovascular: Positive for chest pain.  All other systems reviewed and are negative.    Physical Exam Updated Vital Signs BP 125/60   Pulse 63   Temp 98.4 F (36.9 C) (Oral)   Resp 21   SpO2 91%   Physical Exam  Constitutional: She is oriented to person, place, and time. She appears well-developed and well-nourished.  HENT:  Head: Normocephalic and atraumatic.  Eyes: Conjunctivae and EOM are normal. Pupils are equal, round, and reactive to light.  Neck: Normal range of motion. Neck supple.  Cardiovascular: Normal rate and regular rhythm.  Exam reveals no gallop and no  friction rub.   No murmur heard. Pulmonary/Chest: Effort normal and breath sounds normal. No respiratory distress. She has no wheezes. She has no rales. She exhibits no tenderness.  Abdominal: Soft. Bowel sounds are normal. She exhibits no distension and no mass. There is no tenderness. There is no rebound and no guarding.  Musculoskeletal: Normal range of motion. She exhibits no edema or tenderness.  Neurological: She is alert and oriented to person, place, and time.  Skin: Skin is warm and dry.  Psychiatric: She has a normal mood and affect. Her behavior is normal. Judgment and thought content normal.    Nursing note and vitals reviewed.    ED Treatments / Results  Labs (all labs ordered are listed, but only abnormal results are displayed) Labs Reviewed  BASIC METABOLIC PANEL - Abnormal; Notable for the following:       Result Value   Glucose, Bld 129 (*)    All other components within normal limits  D-DIMER, QUANTITATIVE (NOT AT [facility]) - Abnormal; Notable for the following:    D-Dimer, Quant 0.57 (*)    All other components within normal limits  CBC  I-STAT TROPOININ, ED  I-STAT TROPOININ, ED    EKG  EKG Interpretation  Date/Time:  Sunday November 17 2016 16:46:36 EST Ventricular Rate:  102 PR Interval:  136 QRS Duration: 78 QT Interval:  330 QTC Calculation: 430 R Axis:   95 Text Interpretation:  Sinus tachycardia Right atrial enlargement Rightward axis Pulmonary disease pattern Abnormal ECG No old tracing to compare Confirmed by [facility]  MD, DAVID ([id number]) on 11/17/2016 11:35:19 PM       Radiology Dg Chest 2 View  Result Date: 11/17/2016 CLINICAL DATA:  Chest pain EXAM: CHEST  2 VIEW COMPARISON:  06/25/2016 chest radiograph. FINDINGS: Stable cardiomediastinal silhouette with normal heart size. No pneumothorax. No pleural effusion. Lungs appear clear, with no acute consolidative airspace disease and no pulmonary edema. IMPRESSION: No active cardiopulmonary disease. Electronically Signed   By: Delbert Phenix M.D.   On: 11/17/2016 17:51   Ct Angio Chest Pe W Or Wo Contrast  Result Date: 11/17/2016 CLINICAL DATA:  Mid chest pain radiating to the right side for 2 days. Chest tightness. Bronchitis. Elevated D-dimer. EXAM: CT ANGIOGRAPHY CHEST WITH CONTRAST TECHNIQUE: Multidetector CT imaging of the chest was performed using the standard protocol during bolus administration of intravenous contrast. Multiplanar CT image reconstructions and MIPs were obtained to evaluate the vascular anatomy. CONTRAST:  66 cc Isovue 370 COMPARISON:  Chest x-ray 11/17/2016 FINDINGS: Cardiovascular: Mild  coronary artery calcification. No pericardial effusion. Pulmonary arteries are well opacified.  No acute pulmonary embolus. Mediastinum/Nodes: The visualized portion of the thyroid gland has a normal appearance. No mediastinal, hilar, or axillary adenopathy. The esophagus is normal in appearance. Lungs/Pleura: Emphysematous changes are identified in the upper lobes bilaterally. There is a mosaic appearance of the lung parenchyma consistent with small airways disease. Mucus is identified within lower lobe bronchi on the right. There is mild patchy density in the left lower lobe and lingula, favoring infection/inflammation. There is thickening of the bronchi bilaterally. Upper Abdomen: Low-attenuation lesions are identified within the liver. These likely represent cysts but cannot be fully characterized on this study due to the timing of the contrast bolus. Musculoskeletal: No chest wall abnormality. No acute or significant osseous findings. Review of the MIP images confirms the above findings. IMPRESSION: 1. Technically adequate exam showing no acute pulmonary embolus. 2. Changes within the lungs consistent with emphysema and small airways  disease. 3. Infiltrate in the lingula and left lower lobe and bronchial wall thickening. Findings are consistent with infectious process. Electronically Signed   By: Norva Pavlov M.D.   On: 11/17/2016 23:33    Procedures Procedures (including critical care time)  Medications Ordered in ED Medications  acetaminophen (TYLENOL) tablet 1,000 mg (1,000 mg Oral Given 11/17/16 1946)  ipratropium-albuterol (DUONEB) 0.5-2.5 (3) MG/3ML nebulizer solution 3 mL (3 mLs Nebulization Given 11/17/16 2026)  iopamidol (ISOVUE-370) 76 % injection (66 mLs  Contrast Given 11/17/16 2253)     Initial Impression / Assessment and Plan / ED Course  I have reviewed the triage vital signs and the nursing notes.  Pertinent labs & imaging results that were available during my care of the patient  were reviewed by me and considered in my medical decision making (see chart for details).     Patient with cough, chest pain, shortness breath. Recent URI symptoms. She was treated with azithromycin, prednisone, and inhaler. She states that she has not had any improvement. She needs to have productive cough with white sputum. He states that she feels short of breath. She is noted to be mildly tachycardic 102. Will check d-dimer. Troponin is negative.    D-dimer is elevated. Will proceed with CT angio to rule out PE.  CT chest did not show evidence of PE, but does show lingular infiltrate.  Will treat with augmentin and recommend close follow-up with PCP.  Patient ambulates in the hallway maintaining 94-95% O2 saturation.  Final Clinical Impressions(s) / ED Diagnoses   Final diagnoses:  Community acquired pneumonia of left lower lobe of lung (HCC)    New Prescriptions New Prescriptions   AMOXICILLIN-CLAVULANATE (AUGMENTIN) 875-125 MG TABLET    Take 1 tablet by mouth every 12 (twelve) hours.   GUAIFENESIN (ROBITUSSIN) 100 MG/5ML LIQUID    Take 5-10 mLs (100-200 mg total) by mouth every 4 (four) hours as needed for cough.     Roxy Horseman, PA-C 11/18/16 0045    Tilden Fossa, MD 11/19/16 1044

## 2017-10-22 ENCOUNTER — Encounter (HOSPITAL_BASED_OUTPATIENT_CLINIC_OR_DEPARTMENT_OTHER): Payer: Self-pay

## 2017-10-22 DIAGNOSIS — R0683 Snoring: Secondary | ICD-10-CM

## 2017-11-12 ENCOUNTER — Ambulatory Visit (HOSPITAL_BASED_OUTPATIENT_CLINIC_OR_DEPARTMENT_OTHER): Payer: No Typology Code available for payment source | Attending: Internal Medicine

## 2020-03-09 ENCOUNTER — Emergency Department (HOSPITAL_COMMUNITY): Payer: Self-pay

## 2020-03-09 ENCOUNTER — Encounter (HOSPITAL_COMMUNITY): Payer: Self-pay | Admitting: Emergency Medicine

## 2020-03-09 ENCOUNTER — Emergency Department (HOSPITAL_COMMUNITY)
Admission: EM | Admit: 2020-03-09 | Discharge: 2020-03-09 | Disposition: A | Discharge status: Home / Self Care | Payer: Self-pay | Attending: Emergency Medicine | Admitting: Emergency Medicine

## 2020-03-09 ENCOUNTER — Other Ambulatory Visit: Payer: Self-pay

## 2020-03-09 DIAGNOSIS — F17210 Nicotine dependence, cigarettes, uncomplicated: Secondary | ICD-10-CM | POA: Insufficient documentation

## 2020-03-09 DIAGNOSIS — M5412 Radiculopathy, cervical region: Secondary | ICD-10-CM | POA: Insufficient documentation

## 2020-03-09 DIAGNOSIS — Z79899 Other long term (current) drug therapy: Secondary | ICD-10-CM | POA: Insufficient documentation

## 2020-03-09 MED ORDER — PREDNISONE 10 MG (21) PO TBPK: ORAL_TABLET | Freq: Every day | ORAL | 0 refills | Status: DC

## 2020-03-09 MED ORDER — HYDROCODONE-ACETAMINOPHEN 5-325 MG PO TABS
2.0000 | ORAL_TABLET | Freq: Once | ORAL | Status: AC
  Administered 2020-03-09: 2 via ORAL
  Filled 2020-03-09: qty 2

## 2020-03-09 MED ORDER — NAPROXEN 500 MG PO TABS
500.0000 mg | ORAL_TABLET | Freq: Two times a day (BID) | ORAL | 0 refills | Status: AC | PRN
Start: 2020-03-09 — End: 2020-03-16

## 2020-03-09 NOTE — ED Triage Notes (Addendum)
Pt reports L neck/shoulder pain that began Sunday afternoon, she reports that Tuesday she began having numbness to her forearm, thumb and now more into her index finger. Pt endorses worsening pain with palpation of L trap. Denies any falls or different activities prior to pain. Also endorses weakness to L arm. Taking otc pain meds without relief.

## 2020-03-09 NOTE — ED Provider Notes (Signed)
[facility] EMERGENCY DEPARTMENT Provider Note   CSN: [id number] Arrival date & time: 03/09/20  6812     History Chief Complaint  Patient presents with  . Arm Pain  . Neck Pain    Terri Cameron is a 59 y.o. right hand dominant female presents to emergency department today with chief complaint of progressively worsening left-sided neck and left arm pain x5 days.  Patient denies any injury or recent trauma to the left side.  She states when the pain first started she thought it was a pulled muscle.  She states the pain is mostly on the left side of her neck and in her left shoulder.  She states the pain is constant.  She describes it as sharp.  It radiates down her forearm and into her hand.  She is also endorsing numbness in her left forearm, thumb and index finger.  She has tried taking Tylenol, ibuprofen, gabapentin all without any symptom relief.  She is rating the pain 9 out of 10 in severity.  She denies fever, chills, headache, chest pain, shortness of breath, weakness, wound, rash.  Also denies history of similar pain.  History reviewed. No pertinent past medical history.  There are no problems to display for this patient.   Past Surgical History:  Procedure Laterality Date  . ABLATION    . LAPAROSCOPIC SALPINGO OOPHERECTOMY       OB History   No obstetric history on file.     Family History  Problem Relation Age of Onset  . Hypertension Mother   . Hypertension Father   . Diabetes Father     Social History   Tobacco Use  . Smoking status: Current Every Day Smoker    Packs/day: 1.00    Types: Cigarettes  . Smokeless tobacco: Never Used  Substance Use Topics  . Alcohol use: Yes    Comment: occasional  . Drug use: No    Home Medications Prior to Admission medications   Medication Sig Start Date End Date Taking? Authorizing Provider  acetaminophen (TYLENOL) 500 MG tablet Take 500-1,000 mg by mouth every 6 (six) hours as needed for  headache (or pain).    [provider]  amoxicillin-clavulanate (AUGMENTIN) 875-125 MG tablet Take 1 tablet by mouth every 12 (twelve) hours. 11/18/16   Roxy Horseman, PA-C  azithromycin (ZITHROMAX Z-PAK) 250 MG tablet Take two tablets PO on day 1 and one tablet PO days 2-5 Patient not taking: Reported on 11/17/2016 11/05/16   Renne Crigler, PA-C  benzonatate (TESSALON) 100 MG capsule Take 1 capsule (100 mg total) by mouth every 8 (eight) hours. Patient not taking: Reported on 11/17/2016 11/05/16   Renne Crigler, PA-C  guaiFENesin (ROBITUSSIN) 100 MG/5ML liquid Take 5-10 mLs (100-200 mg total) by mouth every 4 (four) hours as needed for cough. 11/18/16   Roxy Horseman, PA-C  naproxen (NAPROSYN) 500 MG tablet Take 1 tablet (500 mg total) by mouth 2 (two) times daily as needed for up to 7 days. 03/09/20 03/16/20  Harmonee Tozer E, PA-C  predniSONE (STERAPRED UNI-PAK 21 TAB) 10 MG (21) TBPK tablet Take by mouth daily. Take 6 tabs by mouth daily  for 2 days, then 5 tabs for 2 days, then 4 tabs for 2 days, then 3 tabs for 2 days, 2 tabs for 2 days, then 1 tab by mouth daily for 2 days 03/09/20   Mitchell Epling, Caroleen Hamman, PA-C    Allergies    Patient has no known allergies.  Review of  Systems   Review of Systems  All other systems are reviewed and are negative for acute change except as noted in the HPI.   Physical Exam Updated Vital Signs BP (!) 129/59   Pulse 74   Temp 98.4 F (36.9 C) (Oral)   Resp 16   Ht 5\' 9"  (1.753 m)   Wt 82.6 kg   SpO2 98%   BMI 26.88 kg/m   Physical Exam  Constitutional: She is oriented to person, place, and time and well-developed, well-nourished, and in no distress.  HENT:  Head: Normocephalic.  Eyes: Pupils are equal, round, and reactive to light. Conjunctivae and EOM are normal.  Neck:    Tenderness to palpation as depicted in image above.  No overlying skin changes.  No midline cervical spine tenderness.  No step off or deformity.  Pulmonary/Chest:  Effort normal and breath sounds normal.  Musculoskeletal:        General: Normal range of motion.     Cervical back: Neck supple.     Comments: Left shoulder with bony tenderness to palpation. Full ROM. negative empty can test, negative Neer's, negative Lift off. No swelling, erythema or ecchymosis present. No step-off, crepitus, or deformity appreciated. 5/5 muscle strength of bilateral UE. 2+ radial pulse and all compartments soft.  Full ROM of left elbow and wrist. Brisk cap refill  Neurological: She is oriented to person, place, and time. GCS score is 15.  Mental Status:  Alert, oriented, thought content appropriate, able to give a coherent history. Speech fluent without evidence of aphasia. Able to follow 2 step commands without difficulty.  Cranial Nerves:  II:  Peripheral visual fields grossly normal, pupils equal, round, reactive to light III,IV, VI: ptosis not present, extra-ocular motions intact bilaterally  V,VII: smile symmetric, facial light touch sensation equal VIII: hearing grossly normal to voice  X: uvula elevates symmetrically  XI: bilateral shoulder shrug symmetric and strong XII: midline tongue extension without fassiculations Motor:  Normal tone. 5/5 in upper and lower extremities bilaterally including strong and equal grip strength and dorsiflexion/plantar flexion Sensory: subjective decrease sensation to sharp and dull touch in anterior left forearm, left thumb and index finger. Deep Tendon Reflexes: 2+ and symmetric in the biceps and patella Cerebellar: normal finger-to-nose with bilateral upper extremities Gait: normal gait and balance CV: distal pulses palpable throughout     Skin: Skin is warm and dry.  Psychiatric: Mood normal.    ED Results / Procedures / Treatments   Labs (all labs ordered are listed, but only abnormal results are displayed) Labs Reviewed - No data to display  EKG None  Radiology DG Cervical Spine Complete  Result Date:  03/09/2020 CLINICAL DATA:  Pain.  Cervical pain EXAM: CERVICAL SPINE - COMPLETE 4+ VIEW COMPARISON:  None. FINDINGS: No prevertebral soft tissue swelling. Normal alignment of the vertebral bodies. Normal spinal laminal line. Oblique projections demonstrate no traumatic narrowing of the neural foramina. Open mouth odontoid view demonstrates normal alignment of the lateral masses of C1 on C2. Mild endplate spurring and sclerosis at C5-C6. IMPRESSION: 1. No acute findings the cervical spine. 2. No change from prior. 3. 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 Electronically Signed   By: 03/11/2020 M.D.   On: 03/09/2020 13:55   DG Shoulder Left  Result Date: 03/09/2020 CLINICAL DATA:  Shoulder pain EXAM: LEFT SHOULDER - 2+ VIEW COMPARISON:  None. FINDINGS: Glenohumeral joint is intact. No evidence of scapular fracture or humeral fracture. The acromioclavicular joint is intact. IMPRESSION: No  fracture or dislocation. Electronically Signed   By: Suzy Bouchard M.D.   On: 03/09/2020 13:53    Procedures Procedures (including critical care time)  Medications Ordered in ED Medications  HYDROcodone-acetaminophen (NORCO/VICODIN) 5-325 MG per tablet 2 tablet (2 tablets Oral Given 03/09/20 1400)    ED Course  I have reviewed the triage vital signs and the nursing notes.  Pertinent labs & imaging results that were available during my care of the patient were reviewed by me and considered in my medical decision making (see chart for details).    MDM Rules/Calculators/A&P                      History provided by patient with additional history obtained from chart review.   Patient seen and examined. Patient presents awake, alert, hemodynamically stable, afebrile, non toxic.  Patient is well-appearing.  On exam she has subjective decrease sensation in the left forearm, thumb and index finger consistent with dermatome C6.  She has strong and equal grip strength in bilateral upper extremities.  Compartments  are soft in left upper extremity.  X-ray of cervical spine shows disc osteophytic disease at C5-C6, without fracture or dislocation.  X-ray of left shoulder is also negative for fracture dislocation.  Will discharge with prescription for prednisone taper and naproxen.  The patient appears reasonably screened and/or stabilized for discharge and I doubt any other medical condition or other [facility] requiring further screening, evaluation, or treatment in the ED at this time prior to discharge. The patient is safe for discharge with strict return precautions discussed. Recommend neurosurgery follow-up if symptoms persist.  Patient given on-call provider's information   Portions of this note were generated with Dragon dictation software. Dictation errors may occur despite best attempts at proofreading.     Final Clinical Impression(s) / ED Diagnoses Final diagnoses:  Cervical radiculopathy    Rx / DC Orders ED Discharge Orders         Ordered    naproxen (NAPROSYN) 500 MG tablet  2 times daily PRN     03/09/20 1402    predniSONE (STERAPRED UNI-PAK 21 TAB) 10 MG (21) TBPK tablet  Daily     03/09/20 1402           Cherre Robins, PA-C 03/09/20 1416    Noemi Chapel, MD 03/11/20 [phone]

## 2020-03-09 NOTE — Discharge Instructions (Signed)
You have been seen today for neck and arm pain. Please read and follow all provided instructions. Return to the emergency room for worsening condition or new concerning symptoms.    The x-ray did not show any broken bones.  It is likely her pain is being caused by pinched nerve  1. Medications:  -Prescription sent to your pharmacy for a prednisone Dosepak.  Please take this as prescribed.  -Prescription also sent for naproxen.  This is an anti-inflammatory medicine.  Do not take any additional Aleve, ibuprofen or Motrin as these medicines are similar.  Continue usual home medications Take medications as prescribed. Please review all of the medicines and only take them if you do not have an allergy to them.   2. Treatment: rest, drink plenty of fluids  3. Follow Up:  Please follow up with neurosurgeon Dr. Yetta Barre.  I have given you his office contact information.  Please call and schedule the next available follow-up appointment.  It is also a possibility that you have an allergic reaction to any of the medicines that you have been prescribed - Everybody reacts differently to medications and while MOST people have no trouble with most medicines, you may have a reaction such as nausea, vomiting, rash, swelling, shortness of breath. If this is the case, please stop taking the medicine immediately and contact your physician.  ?

## 2020-03-09 NOTE — ED Notes (Signed)
Patient verbalizes understanding of discharge instructions. Opportunity for questioning and answers were provided. Armband removed by staff, pt discharged from ED.  

## 2022-03-14 ENCOUNTER — Ambulatory Visit (INDEPENDENT_AMBULATORY_CARE_PROVIDER_SITE_OTHER): Payer: Self-pay

## 2022-03-14 ENCOUNTER — Ambulatory Visit (INDEPENDENT_AMBULATORY_CARE_PROVIDER_SITE_OTHER): Payer: Self-pay | Admitting: Orthopedic Surgery

## 2022-03-14 DIAGNOSIS — S62646A Nondisplaced fracture of proximal phalanx of right little finger, initial encounter for closed fracture: Secondary | ICD-10-CM

## 2022-03-14 DIAGNOSIS — M79641 Pain in right hand: Secondary | ICD-10-CM

## 2022-03-14 NOTE — Progress Notes (Signed)
Office Visit Note   Patient: Terri Cameron           Date of Birth: 1961/07/06           MRN: [id number] Visit Date: 03/14/2022              Requested by: No referring provider defined for this encounter. PCP: Pcp, No   Assessment & Plan: Visit Diagnoses:  1. Right hand pain   2. Closed nondisplaced fracture of proximal phalanx of right little finger, initial encounter     Plan: We reviewed patient's x-rays which show a nondisplaced fracture at the base of the right small finger proximal phalanx.  We discussed that given the nondisplaced nature of the fracture with no evidence of clinical malalignment or malrotation, we can treat this conservatively.  I will put her back into an ulnar gutter splint today and have her see therapy to have a Thermoplast splint made.  I can see her back after she sees therapy to make sure that there is been no interval displacement of the fracture.  Follow-Up Instructions: No follow-ups on file.   Orders:  Orders Placed This Encounter  Procedures   XR Hand Complete Right   Ambulatory referral to Occupational Therapy   No orders of the defined types were placed in this encounter.     Procedures: No procedures performed   Clinical Data: No additional findings.   Subjective: Chief Complaint  Patient presents with   Right Hand - Injury    This is a 61 year old right-hand-dominant female who presents for follow-up of a right small finger proximal phalanx fracture.  She was playing with her dog in the yard around 6 days ago when she lost her balance and fell.  She landed on her outstretched right hand.  She describes pain at the base of the small finger proximal phalanx around the MCP joint.  She noticed some swelling and ecchymosis.  She was seen at urgent care where x-rays were obtained which demonstrated a nondisplaced fracture to the proximal phalangeal base.  She has been in an ulnar gutter splint since then.  She denies pain elsewhere  in the hand.  Injury   Review of Systems   Objective: Vital Signs: There were no vitals taken for this visit.  Physical Exam Constitutional:      Appearance: Normal appearance.  Cardiovascular:     Rate and Rhythm: Normal rate.     Pulses: Normal pulses.  Pulmonary:     Effort: Pulmonary effort is normal.  Skin:    General: Skin is warm and dry.     Capillary Refill: Capillary refill takes less than 2 seconds.  Neurological:     Mental Status: She is alert.    Right Hand Exam   Tenderness  Right hand tenderness location: TTP at small finger MCP joint at base of proximal phalanx.  Other  Erythema: absent Sensation: normal Pulse: present  Comments:  Mild to moderate swelling of proximal phalangeal base.  ROM of small finger limited by pain and swelling.      Specialty Comments:  No specialty comments available.  Imaging: No results found.   PMFS History: There are no problems to display for this patient.  No past medical history on file.  Family History  Problem Relation Age of Onset   Hypertension Mother    Hypertension Father    Diabetes Father     Past Surgical History:  Procedure Laterality Date   ABLATION  LAPAROSCOPIC SALPINGO OOPHERECTOMY     Social History   Occupational History   Not on file  Tobacco Use   Smoking status: Every Day    Packs/day: 1.00    Types: Cigarettes   Smokeless tobacco: Never  Substance and Sexual Activity   Alcohol use: Yes    Comment: occasional   Drug use: No   Sexual activity: Yes    Birth control/protection: Surgical

## 2022-03-20 ENCOUNTER — Other Ambulatory Visit: Payer: Self-pay

## 2022-03-20 ENCOUNTER — Encounter: Payer: Self-pay | Admitting: Rehabilitative and Restorative Service Providers"

## 2022-03-20 ENCOUNTER — Ambulatory Visit (INDEPENDENT_AMBULATORY_CARE_PROVIDER_SITE_OTHER): Payer: 59 | Admitting: Rehabilitative and Restorative Service Providers"

## 2022-03-20 DIAGNOSIS — R600 Localized edema: Secondary | ICD-10-CM

## 2022-03-20 DIAGNOSIS — M79641 Pain in right hand: Secondary | ICD-10-CM

## 2022-03-20 DIAGNOSIS — M25641 Stiffness of right hand, not elsewhere classified: Secondary | ICD-10-CM

## 2022-03-20 DIAGNOSIS — R278 Other lack of coordination: Secondary | ICD-10-CM

## 2022-03-20 DIAGNOSIS — M6281 Muscle weakness (generalized): Secondary | ICD-10-CM | POA: Diagnosis not present

## 2022-03-20 NOTE — Therapy (Signed)
OUTPATIENT OCCUPATIONAL THERAPY ORTHO EVALUATION  Patient Name: Terri Cameron MRN: [id number] DOB:16-Mar-1961, 61 y.o., female Today's Date: 03/20/2022  PCP: Durward Parcel REFERRING PROVIDER: Dr. Sherilyn Cooter    OT End of Session - 03/20/22 0803     Visit Number 1    Number of Visits 16    Date for OT Re-Evaluation 05/31/22    Authorization Type Friday    Authorization - Number of Visits 30    OT Start Time 0803    OT Stop Time 0853    OT Time Calculation (min) 50 min    Equipment Utilized During Treatment orthotic materials    Activity Tolerance Patient tolerated treatment well;No increased pain;Patient limited by fatigue;Patient limited by pain    Behavior During Therapy [facility] for tasks assessed/performed             History reviewed. No pertinent past medical history. Past Surgical History:  Procedure Laterality Date   ABLATION     LAPAROSCOPIC SALPINGO OOPHERECTOMY     There are no problems to display for this patient.   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 She states she fell while playing with her dog.    PERTINENT HISTORY: Per MD: "Right small finger proximal phalangeal base fracture.  Non displaced and will treat non operatively.  Needs thermoplast splint."  PRECAUTIONS: ~1.5 weeks post fx of rt SF P1 base, non-op, needs custom orthosis for 3-4 more weeks of immobilization  WEIGHT BEARING RESTRICTIONS Yes NWB in right hand now  PAIN:  Are you having pain? no Rating: 0/10 now at rest  FALLS: Has patient fallen in  last 6 months? Yes. Number of falls 1 (this one)  LIVING ENVIRONMENT: Lives with: lives with their spouse  PLOF: Independent  PATIENT GOALS "to get my hand better to get back to work and take care of myself."    OBJECTIVE:   HAND DOMINANCE: Right   ADLs: Overall ADLs: States decreased ability to grab, hold household objects, pain and inability to pen containers, perform all FMS tasks, bathing and dressing and basic tasks as well, etc.    FUNCTIONAL OUTCOME MEASURES: Eval: Patient Specific Functional Scale: 3 (cashier, writing, BADLs)  UPPER EXTREMITY ROM    03/20/22 Eval:  SF NT AROM due to healing fx, wrist checked and mildly tight from immobilization, RF also observed tighter. Digits 1, 2 were left out of cast and moving fairly well. Detailed measures TBD when safe to move small finger  Active ROM Right eval  Wrist flexion 66  Wrist extension 55  (Blank rows = not tested)  Active ROM Right eval  Long MCP (0-90)    Long PIP (0-100)    Long DIP (0-70)    Ring MCP (0-90)    Ring PIP (0-100)    Ring DIP (0-70)    Little MCP (0-90)    Little PIP (0-100)    Little DIP (0-70)    (Blank rows = not tested)   UPPER EXTREMITY MMT:    Eval: NT due to fx   MMT Right eval  Wrist flexion   Wrist  extension   Wrist ulnar deviation   Wrist radial deviation   Wrist pronation   Wrist supination   (Blank rows = not tested)  HAND FUNCTION: Eval: grossly weak today due to immobilization and fx, not safe to test yet Grip strength Right: TBD lbs, Left: TBD lbs   COORDINATION: Eval: Grossly decreased observed ability to move right dominant hand now, TBD detailed measures when allowable Box and Blocks Test: TBD Blocks today (56 is [facility]); 9 Hole Peg Test Right: TBDsec  SENSATION: Eval:  Light touch intact today, no c/o  EDEMA:   Eval: Mildly swollen in hand today, especially by ulnar side/small finger   COGNITION: Overall cognitive status: WFL for evaluation today    OBSERVATIONS:   Eval: some mild bruising near SF dorsal DIP J, perhaps from cast, otherwise, mildly swollen to be expected, no extreme TTP or sensation loss, etc.    TODAY'S TREATMENT:  Eval: Custom orthotic fabrication was indicated due to pt's healing SF fracture and need for safe, functional positioning. OT fabricated custom SAFE position, hand-based orthotic including SF and RF for pt today to immobilize SF and RF in safe, functional position and minimize flexion contracture formation. It fit well with no areas of pressure, pt states a comfortable fit. Pt was educated on the wearing schedule, to call or come in ASAP if it is causing any irritation or is not achieving desired function. It will be checked/adjusted in upcoming sessions, as needed. Pt states understanding.   She was also edu 4-6 x day AROM at shoulder, elbow, wrist and digits 1-3 of right hand. OT also allows her to remove orthotic 1 x day for self care to carefully wash hand (she does today), ensuring no bending of SF outside of SAFE position.  OT also allows her, only if not painful, to hold SF in SAFE position with left hand and move RF in gentle, limited AROM composite flex and ext 2-3 x day to prevent stiffness there. She states understanding and has no pain at end of session.   PATIENT EDUCATION: Education details: See tx section above for details  Person educated: Patient Education method: Verbal Instruction, Teach back, Handouts  Education comprehension: States and demonstrates understanding, Additional Education required    HOME EXERCISE PROGRAM: See tx section above for details   GOALS: Goals reviewed with patient? Yes   SHORT TERM GOALS: (STG required if POC>30 days)  Pt will obtain protective, custom orthotic. Target date: 03/20/22 Goal status: MET  2.  Pt will demo/state understanding of initial HEP to improve pain levels and prerequisite motion. Target date: 03/20/22 Goal status: Met   LONG TERM  GOALS:  Pt will improve functional ability by decreased impairment per PSFS assessment from 3 to 8.5 or better, for better quality of life. Target date: 05/31/22 Goal status: INITIAL  2.  Pt will improve grip strength in right dom hand to at least 45lbs for functional use at home and in IADLs. Target date: 05/31/22 Goal status: INITIAL  3.  Pt will improve A/ROM in right SF TAM to at least 200*, to have functional motion for tasks like reach and grasp.  Target date: 05/31/22 Goal status: INITIAL  4.  Pt will improve strength in right wrist flex, ext to 5/5 MMT to have increased functional ability to carry out selfcare and higher-level homecare tasks with no difficulty. Target date: 05/31/22 Goal status: INITIAL  5.  Pt will maintain low pain levels (<2/10) consistently to have consistent occupational participation in  daily roles. Target date: 05/31/22 Goal status: INITIAL    ASSESSMENT:  CLINICAL IMPRESSION: Patient is a 61 y.o. female who was seen today for occupational therapy evaluation for broken right small finger and decrease functional ability.   PERFORMANCE DEFICITS in functional skills including ADLs, IADLs, coordination, dexterity, edema, ROM, strength, pain, fascial restrictions, muscle spasms, flexibility, FMC, GMC, body mechanics, endurance, and UE functional use and psychosocial skills including coping strategies, environmental adaptation, and routines and behaviors.   IMPAIRMENTS are limiting patient from ADLs, IADLs, work, play, leisure, and social participation.   COMORBIDITIES may have co-morbidities  that affects occupational performance. Patient will benefit from skilled OT to address above impairments and improve overall function.  MODIFICATION OR ASSISTANCE TO COMPLETE EVALUATION: No modification of tasks or assist necessary to complete an evaluation.  OT OCCUPATIONAL PROFILE AND HISTORY: Problem focused assessment: Including review of records relating to  presenting problem.  CLINICAL DECISION MAKING: LOW - limited treatment options, no task modification necessary  REHAB POTENTIAL: Excellent  EVALUATION COMPLEXITY: Low      PLAN: OT FREQUENCY: Up to 2x week once allowed A/PROM, to help achieve full motion, strength and ability.   OT DURATION: 10 weeks (up to 05/31/22- to allot for healing time as well)   PLANNED INTERVENTIONS: self care/ADL training, therapeutic exercise, therapeutic activity, neuromuscular re-education, manual therapy, passive range of motion, splinting, electrical stimulation, ultrasound, fluidotherapy, compression bandaging, moist heat, cryotherapy, contrast bath, patient/family education, coping strategies training, and DME and/or AE instructions  RECOMMENDED OTHER SERVICES: none now   CONSULTED AND AGREED WITH PLAN OF CARE: Patient  PLAN FOR NEXT SESSION: Check orthosis and initial HEP as needed in next 3-4 weeks, otherwise we are waiting for MD to declare fx is healing adequately   Benito Mccreedy, OTR/L, CHT 03/20/2022, 9:30 AM

## 2022-03-25 ENCOUNTER — Ambulatory Visit: Payer: Self-pay

## 2022-03-25 ENCOUNTER — Ambulatory Visit (INDEPENDENT_AMBULATORY_CARE_PROVIDER_SITE_OTHER): Payer: 59 | Admitting: Orthopedic Surgery

## 2022-03-25 DIAGNOSIS — S62646A Nondisplaced fracture of proximal phalanx of right little finger, initial encounter for closed fracture: Secondary | ICD-10-CM

## 2022-03-25 NOTE — Progress Notes (Signed)
   Office Visit Note   Patient: Terri Cameron           Date of Birth: [birth year]           MRN: [id number] Visit Date: 03/25/2022              Requested by: No referring provider defined for this encounter. PCP: Pcp, No   Assessment & Plan: Visit Diagnoses:  1. Closed nondisplaced fracture of proximal phalanx of right little finger, initial encounter     Plan: Patient is approximately 11 days status post right small finger proximal phalanx fracture.  The fracture is nondisplaced.  She presents today for repeat x-rays.  X-rays show the fracture remains nondisplaced and unchanged alignment from previous.  She is in a removable thermoplastic splint made by therapy.  I will see her back in 2 weeks for repeat x-rays.  Follow-Up Instructions: No follow-ups on file.   Orders:  Orders Placed This Encounter  Procedures   XR Finger Little Right   No orders of the defined types were placed in this encounter.     Procedures: No procedures performed   Clinical Data: No additional findings.   Subjective: Chief Complaint  Patient presents with   Right Little Finger - Follow-up, Fracture    Is a 61 year old right-hand-dominant female presents for follow-up of a right small finger proximal phalanx fracture.  She was playing with her dog around 11 days ago when she lost her balance and fell.  She was found to have a nondisplaced fracture of the base of the small finger proximal phalanx.  She has been seen by hand therapy and a removable thermoplastic plan was made.  She is doing well today.  She has no complaints.     Review of Systems   Objective: Vital Signs: There were no vitals taken for this visit.  Physical Exam  Right Hand Exam   Tenderness  Right hand tenderness location: TTP at base of small finger proximal phalanx.  Other  Erythema: absent Sensation: normal Pulse: present  Comments:  Finger swelling appears improved      Specialty Comments:  No  specialty comments available.  Imaging: No results found.   PMFS History: There are no problems to display for this patient.  No past medical history on file.  Family History  Problem Relation Age of Onset   Hypertension Mother    Hypertension Father    Diabetes Father     Past Surgical History:  Procedure Laterality Date   ABLATION     LAPAROSCOPIC SALPINGO OOPHERECTOMY     Social History   Occupational History   Not on file  Tobacco Use   Smoking status: Every Day    Packs/day: 1.00    Types: Cigarettes   Smokeless tobacco: Never  Substance and Sexual Activity   Alcohol use: Yes    Comment: occasional   Drug use: No   Sexual activity: Yes    Birth control/protection: Surgical

## 2022-04-08 ENCOUNTER — Ambulatory Visit (INDEPENDENT_AMBULATORY_CARE_PROVIDER_SITE_OTHER): Payer: 59

## 2022-04-08 ENCOUNTER — Ambulatory Visit (INDEPENDENT_AMBULATORY_CARE_PROVIDER_SITE_OTHER): Payer: 59 | Admitting: Orthopedic Surgery

## 2022-04-08 ENCOUNTER — Ambulatory Visit (INDEPENDENT_AMBULATORY_CARE_PROVIDER_SITE_OTHER): Payer: 59 | Admitting: Rehabilitative and Restorative Service Providers"

## 2022-04-08 ENCOUNTER — Encounter: Payer: Self-pay | Admitting: Rehabilitative and Restorative Service Providers"

## 2022-04-08 DIAGNOSIS — M25641 Stiffness of right hand, not elsewhere classified: Secondary | ICD-10-CM

## 2022-04-08 DIAGNOSIS — R600 Localized edema: Secondary | ICD-10-CM | POA: Diagnosis not present

## 2022-04-08 DIAGNOSIS — M79641 Pain in right hand: Secondary | ICD-10-CM

## 2022-04-08 DIAGNOSIS — S62646A Nondisplaced fracture of proximal phalanx of right little finger, initial encounter for closed fracture: Secondary | ICD-10-CM

## 2022-04-08 DIAGNOSIS — R278 Other lack of coordination: Secondary | ICD-10-CM | POA: Diagnosis not present

## 2022-04-08 DIAGNOSIS — M6281 Muscle weakness (generalized): Secondary | ICD-10-CM | POA: Diagnosis not present

## 2022-04-08 NOTE — Therapy (Addendum)
OUTPATIENT OCCUPATIONAL THERAPY TREATMENT & DISCHARGE NOTE   Patient Name: Terri Cameron MRN: [id number] DOB:Jun 21, 1961, 61 y.o., female Today's Date: 04/08/2022  PCP: Durward Parcel REFERRING PROVIDER: Dr. Sherilyn Cooter   END OF SESSION:   OT End of Session - 04/08/22 0850     Visit Number 2    Number of Visits 16    Date for OT Re-Evaluation 05/31/22    Authorization Type Friday    Authorization - Number of Visits 30    OT Start Time 0852    OT Stop Time 0928    OT Time Calculation (min) 36 min    Activity Tolerance Patient tolerated treatment well;No increased pain;Patient limited by fatigue;Patient limited by pain    Behavior During Therapy [facility] for tasks assessed/performed             History reviewed. No pertinent past medical history. Past Surgical History:  Procedure Laterality Date   ABLATION     LAPAROSCOPIC SALPINGO OOPHERECTOMY     There are no problems to display for this patient.   ONSET DATE: DOI 03/08/22    REFERRING DIAG: U54.270W (ICD-10-CM) - Closed nondisplaced fracture of proximal phalanx of right little finger, initial encounter    THERAPY DIAG:  Muscle weakness (generalized)  Localized edema  Other lack of coordination  Stiffness of right hand, not elsewhere classified  Pain in right hand  Rationale for Evaluation and Treatment Rehabilitation  PERTINENT HISTORY: Per MD: "Right small finger proximal phalangeal base fracture.  Non displaced and will treat non operatively.  Needs thermoplast splint."   PRECAUTIONS: 4+ weeks post fx of rt SF P1 base, non-op, AROM ok now, weaning orth for fnl at 5-6 weeks (buddy strap), start PROM at 5-6 weeks lightly, dynamics ok at 6-7 weeks, 7-8 wks ok for PRE in hand and reduce reliance on orthotic    WEIGHT BEARING RESTRICTIONS Yes NWB in right hand now  SUBJECTIVE:  She states having a good month, brace rubbing a bit at thumb.   PAIN:  Are you having pain? No Rating: 0/10 at rest now     OBJECTIVE: (All objective assessments below are from initial evaluation on: 03/20/22 unless otherwise specified.)   HAND DOMINANCE: Right    ADLs: Overall ADLs: States decreased ability to grab, hold household objects, pain and inability to pen containers, perform all FMS tasks, bathing and dressing and basic tasks as well, etc.      FUNCTIONAL OUTCOME MEASURES: Eval: Patient Specific Functional Scale: 3 (cashier, writing, BADLs)   UPPER EXTREMITY ROM    03/20/22 Eval:  SF NT AROM due to healing fx, wrist checked and mildly tight from immobilization, RF also observed tighter. Digits 1, 2 were left out of cast and moving fairly well. Detailed measures TBD when safe to move small finger   Active ROM Right eval  Wrist flexion 66  Wrist extension 55  (Blank rows = not tested)   Active ROM Right eval  Little MCP (0-90) 0- 55   Little PIP (0-100) (-1) - 99   Little DIP (0-70) 0 - 54   (Blank rows = not tested)     UPPER EXTREMITY MMT:    Eval: NT due to fx    MMT Right eval  Wrist flexion    Wrist extension    Wrist ulnar deviation    Wrist radial deviation    Wrist pronation    Wrist supination    (Blank rows = not tested)   HAND FUNCTION: Eval: grossly weak  today due to immobilization and fx, not safe to test yet Grip strength Right: TBD lbs, Left: TBD lbs    COORDINATION: Eval: Grossly decreased observed ability to move right dominant hand now, TBD detailed measures when allowable Box and Blocks Test: TBD Blocks today (56 is [facility]); 9 Hole Peg Test Right: TBDsec   SENSATION: Eval:  Light touch intact today, no c/o   EDEMA:             Eval: Mildly swollen in hand today, especially by ulnar side/small finger    COGNITION: Overall cognitive status: WFL for evaluation today    OBSERVATIONS:             Eval: some mild bruising near SF dorsal DIP J, perhaps from cast, otherwise, mildly swollen to be expected, no extreme TTP or sensation loss, etc.      TODAY'S  TREATMENT:  04/08/22: OT modifies orthotic to have no rubbing at thumb now.  OT also upgrades HEP to gentle mid-range AROM as well as blocking AROM to DIP and PIP Js. She tolerates well with no pain and is reminded to not use hand functionally yet for anything weighted.   Exercises - Touch Thumb to [facility] Finger  - 3-4 x daily - 1-2 sets - 10 reps - Tendon Glides  - 3-4 x daily - 3-5 reps - 2-3 seconds hold - Seated Finger DIP Flexion AROM with Blocking  - 4-6 x daily - 1 sets - 10-15 reps - Hand AROM PIP Blocking  - 4-6 x daily - 1 sets - 10-15 reps   Eval: Custom orthotic fabrication was indicated due to pt's healing SF fracture and need for safe, functional positioning. OT fabricated custom SAFE position, hand-based orthotic including SF and RF for pt today to immobilize SF and RF in safe, functional position and minimize flexion contracture formation. It fit well with no areas of pressure, pt states a comfortable fit. Pt was educated on the wearing schedule, to call or come in ASAP if it is causing any irritation or is not achieving desired function. It will be checked/adjusted in upcoming sessions, as needed. Pt states understanding.    She was also edu 4-6 x day AROM at shoulder, elbow, wrist and digits 1-3 of right hand. OT also allows her to remove orthotic 1 x day for self care to carefully wash hand (she does today), ensuring no bending of SF outside of SAFE position.  OT also allows her, only if not painful, to hold SF in SAFE position with left hand and move RF in gentle, limited AROM composite flex and ext 2-3 x day to prevent stiffness there. She states understanding and has no pain at end of session.    PATIENT EDUCATION: Education details: See tx section above for details  Person educated: Patient Education method: Verbal Instruction, Teach back, Handouts  Education comprehension: States and demonstrates understanding, Additional Education required      HOME EXERCISE  PROGRAM: Access Code: [email or web address] URL: [email or web address] Date: 04/08/2022 Prepared by: Benito Mccreedy    GOALS: Goals reviewed with patient? Yes     SHORT TERM GOALS: (STG required if POC>30 days)   Pt will obtain protective, custom orthotic. Target date: 03/20/22 Goal status: MET   2.  Pt will demo/state understanding of initial HEP to improve pain levels and prerequisite motion. Target date: 03/20/22 Goal status: Met     LONG TERM GOALS:   Pt will improve functional ability by decreased impairment per PSFS assessment from  3 to 8.5 or better, for better quality of life. Target date: 05/31/22 Goal status: INITIAL   2.  Pt will improve grip strength in right dom hand to at least 45lbs for functional use at home and in IADLs. Target date: 05/31/22 Goal status: INITIAL   3.  Pt will improve A/ROM in right SF TAM to at least 200*, to have functional motion for tasks like reach and grasp.  Target date: 05/31/22 Goal status: INITIAL   4.  Pt will improve strength in right wrist flex, ext to 5/5 MMT to have increased functional ability to carry out selfcare and higher-level homecare tasks with no difficulty. Target date: 05/31/22 Goal status: INITIAL   5.  Pt will maintain low pain levels (<2/10) consistently to have consistent occupational participation in daily roles. Target date: 05/31/22 Goal status: INITIAL       ASSESSMENT:   CLINICAL IMPRESSION: 04/08/22: Doing well, no increases in pain.   Eval: Patient is a 61 y.o. female who was seen today for occupational therapy evaluation for broken right small finger and decrease functional ability.      PLAN: OT FREQUENCY: Up to 2x week once allowed A/PROM, to help achieve full motion, strength and ability.    OT DURATION: 10 weeks (up to 05/31/22- to allot for healing time as well)    PLANNED INTERVENTIONS: self care/ADL training, therapeutic exercise, therapeutic activity, neuromuscular re-education, manual  therapy, passive range of motion, splinting, electrical stimulation, ultrasound, fluidotherapy, compression bandaging, moist heat, cryotherapy, contrast bath, patient/family education, coping strategies training, and DME and/or AE instructions   RECOMMENDED OTHER SERVICES: none now    CONSULTED AND AGREED WITH PLAN OF CARE: Patient   PLAN FOR NEXT SESSION:  F/U with her in 2 weeks after MD f/u at 6 weeks to determine healing and start PROM/dynamics  as tolerated.  07/03/22: DISCHARGE    Benito Mccreedy, OTR/L, CHT 04/08/2022, 6:04 PM     OCCUPATIONAL THERAPY DISCHARGE SUMMARY  Visits from Start of Care: 2  The pt did not return to therapy for months, did not call in or call back. Will be d/c from therapy at this point per policy.  Goals could not be updated, please see notes for details.   Benito Mccreedy, OTR/L, CHT 07/03/22

## 2022-04-23 ENCOUNTER — Telehealth: Payer: Self-pay | Admitting: Rehabilitative and Restorative Service Providers"

## 2022-04-23 ENCOUNTER — Ambulatory Visit: Payer: 59 | Admitting: Orthopedic Surgery

## 2022-04-23 ENCOUNTER — Encounter: Payer: 59 | Admitting: Rehabilitative and Restorative Service Providers"

## 2022-04-23 NOTE — Telephone Encounter (Signed)
OT called patient to discuss missed appointment. OT left message that she needs to call in to get rescheduled with MD and therapy. She was reminded of the attendance policy and future missed appointments could lead to early discharge from therapy.

## 2022-05-01 ENCOUNTER — Other Ambulatory Visit: Payer: Self-pay | Admitting: *Deleted

## 2022-05-01 ENCOUNTER — Encounter: Payer: Self-pay | Admitting: *Deleted

## 2022-05-06 ENCOUNTER — Encounter: Payer: Self-pay | Admitting: Diagnostic Neuroimaging

## 2022-05-06 ENCOUNTER — Ambulatory Visit (INDEPENDENT_AMBULATORY_CARE_PROVIDER_SITE_OTHER): Payer: 59 | Admitting: Diagnostic Neuroimaging

## 2022-05-06 VITALS — BP 130/59 | HR 55 | Ht 68.0 in | Wt 160.5 lb

## 2022-05-06 DIAGNOSIS — I6521 Occlusion and stenosis of right carotid artery: Secondary | ICD-10-CM | POA: Diagnosis not present

## 2022-05-06 DIAGNOSIS — R200 Anesthesia of skin: Secondary | ICD-10-CM | POA: Diagnosis not present

## 2022-05-06 DIAGNOSIS — I771 Stricture of artery: Secondary | ICD-10-CM | POA: Diagnosis not present

## 2022-05-06 MED ORDER — ALPRAZOLAM 0.5 MG PO TABS: ORAL_TABLET | ORAL | 0 refills | Status: AC

## 2022-05-06 NOTE — Progress Notes (Signed)
[facility]  PATIENT: Terri Cameron DOB: [birth year]  REFERRING CLINICIAN: Florendo, Vevelyn Royals., * HISTORY FROM: patient REASON FOR VISIT: new consult   HISTORICAL  CHIEF COMPLAINT:  Chief Complaint  Patient presents with   New Patient (Initial Visit)    Pt reports still feeling numbness in left hand and tingling in the lower extremities and some numbness. Room 7 alone     HISTORY OF PRESENT ILLNESS:   61 year old female here for evaluation of left-sided numbness and tingling.  Symptoms started 4 to 5 years ago with numbness and weird sensations in the left foot and leg.  She feels cold sensation in the left foot.  She also feels that it "does not work right".  She has had some trips and falls related to this.  No last 1 to 2 years she is also notes problems extending into her left hand and arm with similar numbness and incoordination.  No problems with right side.  No problems with neck or low back.  No problems with face, vision or speech.  She went to PCP and had extensive neuropathy testing which were unremarkable.  Also had CT of the head which was unremarkable.   REVIEW OF SYSTEMS: Full 14 system review of systems performed and negative with exception of: as per HPI.  ALLERGIES: No Known Allergies  HOME MEDICATIONS: Outpatient Medications Prior to Visit  Medication Sig Dispense Refill   acetaminophen (TYLENOL) 500 MG tablet Take 500-1,000 mg by mouth every 6 (six) hours as needed for headache (or pain).     celecoxib (CELEBREX) 200 MG capsule Take 200 mg by mouth 2 (two) times daily.     MAGNESIUM-OXIDE 400 (240 Mg) MG tablet Take 1 tablet by mouth daily.     pregabalin (LYRICA) 150 MG capsule Take 150 mg by mouth 2 (two) times daily.     rosuvastatin (CRESTOR) 5 MG tablet Take 5 mg by mouth daily.     Vitamin D, Ergocalciferol, (DRISDOL) 1.25 MG (50000 UNIT) CAPS capsule Take 50,000 Units by mouth every 14 (fourteen) days.      amoxicillin-clavulanate (AUGMENTIN) 875-125 MG tablet Take 1 tablet by mouth every 12 (twelve) hours. (Patient not taking: Reported on 05/06/2022) 14 tablet 0   azithromycin (ZITHROMAX Z-PAK) 250 MG tablet Take two tablets PO on day 1 and one tablet PO days 2-5 (Patient not taking: Reported on 11/17/2016) 6 tablet 0   benzonatate (TESSALON) 100 MG capsule Take 1 capsule (100 mg total) by mouth every 8 (eight) hours. (Patient not taking: Reported on 11/17/2016) 15 capsule 0   guaiFENesin (ROBITUSSIN) 100 MG/5ML liquid Take 5-10 mLs (100-200 mg total) by mouth every 4 (four) hours as needed for cough. (Patient not taking: Reported on 03/20/2022) 60 mL 0   predniSONE (STERAPRED UNI-PAK 21 TAB) 10 MG (21) TBPK tablet Take by mouth daily. Take 6 tabs by mouth daily  for 2 days, then 5 tabs for 2 days, then 4 tabs for 2 days, then 3 tabs for 2 days, 2 tabs for 2 days, then 1 tab by mouth daily for 2 days (Patient not taking: Reported on 03/20/2022) 42 tablet 0   No facility-administered medications prior to visit.    PAST MEDICAL HISTORY: Past Medical History:  Diagnosis Date   Anxiety    Chronic pain    COPD (chronic obstructive pulmonary disease) (HCC)    mild   H/O vitamin D deficiency    Hypercholesterolemia    Insomnia    Sensory  neuropathy     PAST SURGICAL HISTORY: Past Surgical History:  Procedure Laterality Date   ABDOMINAL HYSTERECTOMY     ABLATION     CESAREAN SECTION     x2   LAPAROSCOPIC SALPINGO OOPHERECTOMY      FAMILY HISTORY: Family History  Problem Relation Age of Onset   Hyperlipidemia Mother    Hypertension Mother    Hyperlipidemia Father    Hypertension Father    Diabetes Father    Leukemia Brother     SOCIAL HISTORY: Social History   Socioeconomic History   Marital status: Single    Spouse name: Not on file   Number of children: 4   Years of education: Not on file   Highest education level: Not on file  Occupational History   Not on file  Tobacco Use    Smoking status: Every Day    Packs/day: 0.50    Types: Cigarettes   Smokeless tobacco: Never  Substance and Sexual Activity   Alcohol use: Yes    Comment: occasional   Drug use: No   Sexual activity: Yes    Birth control/protection: Surgical  Other Topics Concern   Not on file  Social History Narrative   Not on file   Social Determinants of Health   Financial Resource Strain: Not on file  Food Insecurity: Not on file  Transportation Needs: Not on file  Physical Activity: Not on file  Stress: Not on file  Social Connections: Not on file  Intimate Partner Violence: Not on file     PHYSICAL EXAM  GENERAL EXAM/CONSTITUTIONAL: Vitals:  Vitals:   05/06/22 0842  BP: (!) 130/59  Pulse: (!) 55  Weight: 160 lb 8 oz (72.8 kg)  Height: 5\' 8"  (1.727 m)   Body mass index is 24.4 kg/m. Wt Readings from Last 3 Encounters:  05/06/22 160 lb 8 oz (72.8 kg)  03/09/20 182 lb (82.6 kg)  10/20/15 178 lb (80.7 kg)   Patient is in no distress; well developed, nourished and groomed; neck is supple  CARDIOVASCULAR: Examination of carotid arteries is normal; RIGHT CAROTID BRUIT Regular rate and rhythm, no murmurs Examination of peripheral vascular system by observation and palpation is normal  EYES: Ophthalmoscopic exam of optic discs and posterior segments is normal; no papilledema or hemorrhages No results found.  MUSCULOSKELETAL: Gait, strength, tone, movements noted in Neurologic exam below  NEUROLOGIC: MENTAL STATUS:      No data to display         awake, alert, oriented to person, place and time recent and remote memory intact normal attention and concentration language fluent, comprehension intact, naming intact fund of knowledge appropriate  CRANIAL NERVE:  2nd - no papilledema on fundoscopic exam 2nd, 3rd, 4th, 6th - pupils equal and reactive to light, visual fields full to confrontation, extraocular muscles intact, no nystagmus 5th - facial sensation  symmetric 7th - facial strength symmetric 8th - hearing intact 9th - palate elevates symmetrically, uvula midline 11th - shoulder shrug symmetric 12th - tongue protrusion midline  MOTOR:  normal bulk and tone, full strength in the BUE, BLE  SENSORY:  normal and symmetric to light touch, temperature, vibration; EXCEPT DECR IN LEFT FOOT  COORDINATION:  finger-nose-finger, fine finger movements normal  REFLEXES:  deep tendon reflexes 1+ and symmetric  GAIT/STATION:  narrow based gait; able to walk on toes, heels     DIAGNOSTIC DATA (LABS, IMAGING, TESTING) - I reviewed patient records, labs, notes, testing and imaging myself where available.  Lab Results  Component Value Date   WBC 6.7 11/17/2016   HGB 14.5 11/17/2016   HCT 42.7 11/17/2016   MCV 89.3 11/17/2016   PLT 273 11/17/2016      Component Value Date/Time   NA 136 11/17/2016 1640   K 4.2 11/17/2016 1640   CL 101 11/17/2016 1640   CO2 22 11/17/2016 1640   GLUCOSE 129 (H) 11/17/2016 1640   BUN 12 11/17/2016 1640   CREATININE 0.68 11/17/2016 1640   CALCIUM 9.7 11/17/2016 1640   GFRNONAA >60 11/17/2016 1640   GFRAA >60 11/17/2016 1640   No results found for: "CHOL", "HDL", "LDLCALC", "LDLDIRECT", "TRIG", "CHOLHDL" No results found for: "HGBA1C" No results found for: "VITAMINB12" No results found for: "TSH"  04/03/22 CT head  - unremarkable   ASSESSMENT AND PLAN  61 y.o. year old female here with gradual progressive numbness, tingling incoordination in the left foot and leg, now in left hand, over the past 4 to 5 years.   Dx:  1. Left sided numbness      PLAN:  LEFT HAND / LEFT LEG NUMBNESS / INCOORDINATION (rule out mass, inflamm or stroke) - check MRI brain - if negative, then consider MRI cervical and lumbar spine  RIGHT CAROTID BRUIT (hyperlipidemia, smoking) - check carotid u/s  Orders Placed This Encounter  Procedures   MR BRAIN W WO CONTRAST   VAS US CAROTID   Meds ordered this  encounter  Medications   ALPRAZolam (XANAX) 0.5 MG tablet    Sig: for sedation before MRI scan; take 1 tab 1 hour before scan; may repeat 1 tab 15 min before scan    Dispense:  3 tablet    Refill:  0   Return for pending if symptoms worsen or fail to improve, pending test results.    Suanne Marker, MD 05/06/2022, 9:42 AM Certified in Neurology, Neurophysiology and Neuroimaging  [facility] [address] [place], [place] [address] [phone]

## 2022-05-06 NOTE — Patient Instructions (Signed)
  LEFT HAND / LEFT LEG NUMBNESS / INCOORDINATION - check MRI brain - if negative, then consider MRI cervical and lumbar spine  RIGHT CAROTID BRUIT - check carotid u/s

## 2022-05-08 ENCOUNTER — Telehealth: Payer: Self-pay | Admitting: Diagnostic Neuroimaging

## 2022-05-08 NOTE — Telephone Encounter (Signed)
[facility] is inactive, medicaid NPR sent to [facility]

## 2022-05-12 IMAGING — CR DG CERVICAL SPINE COMPLETE 4+V
6 series · 7 of 7 positions shown · non-contrast
Comparison: None.

CLINICAL DATA: Pain.  Cervical pain

EXAM:
CERVICAL SPINE - COMPLETE 4+ VIEW

[c-spine lat]
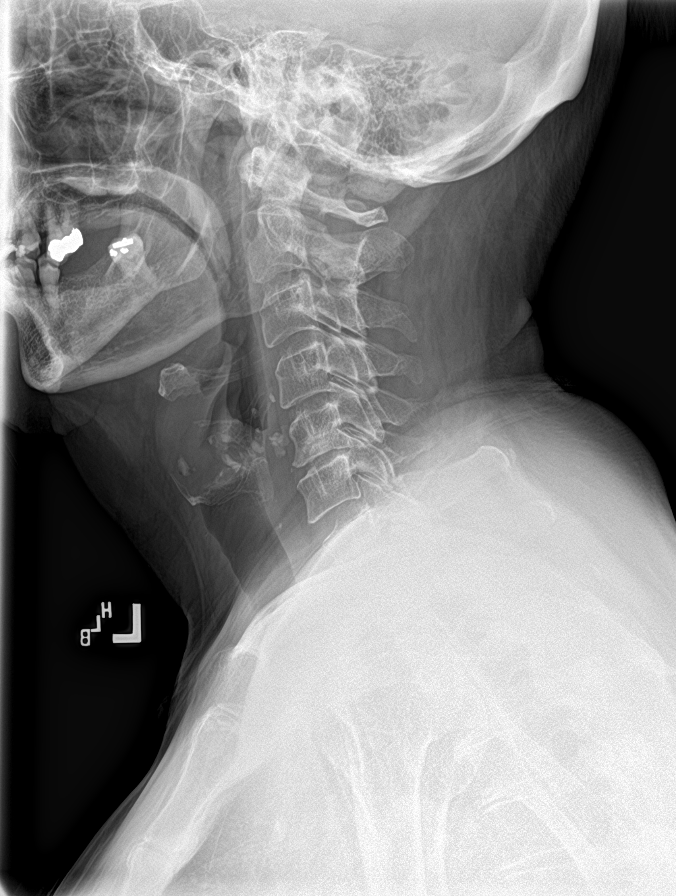

[c-spine obl (1 of 2)]
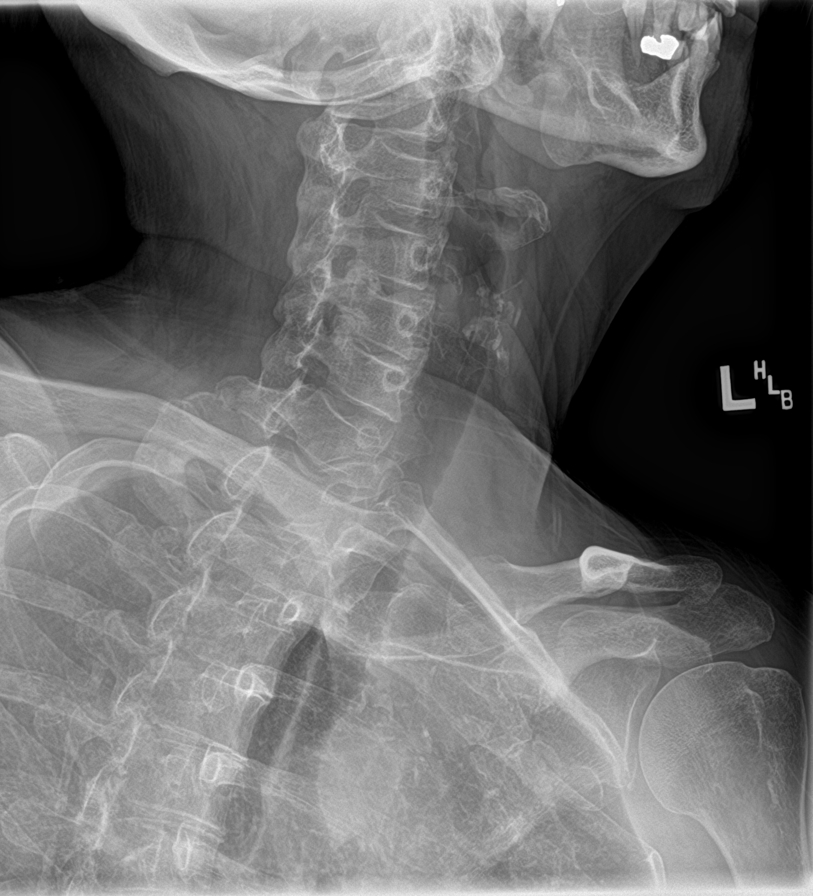

[c-spine obl (2 of 2)]
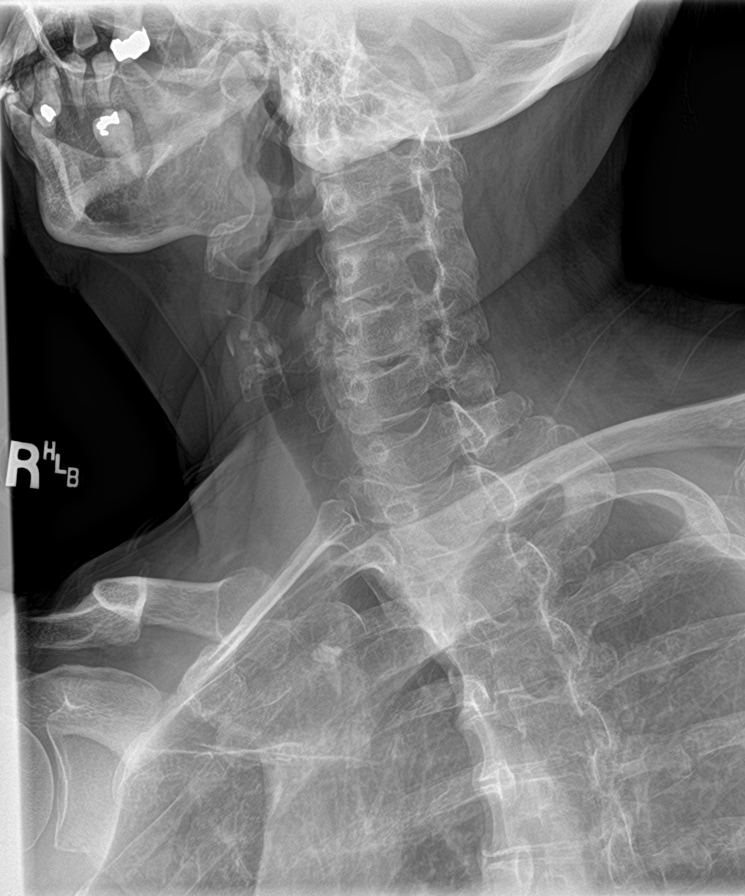

[c-spine ap]
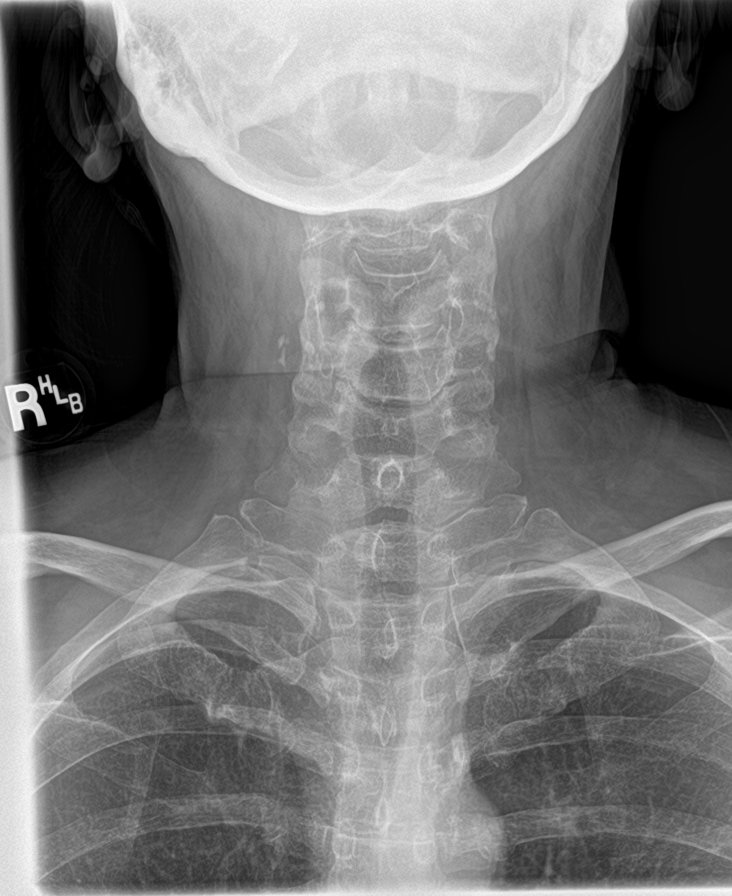

[Series 5: c-spine open mouth · 0.14mm/px · 2 of 2 slices shown]
[im 1/2]
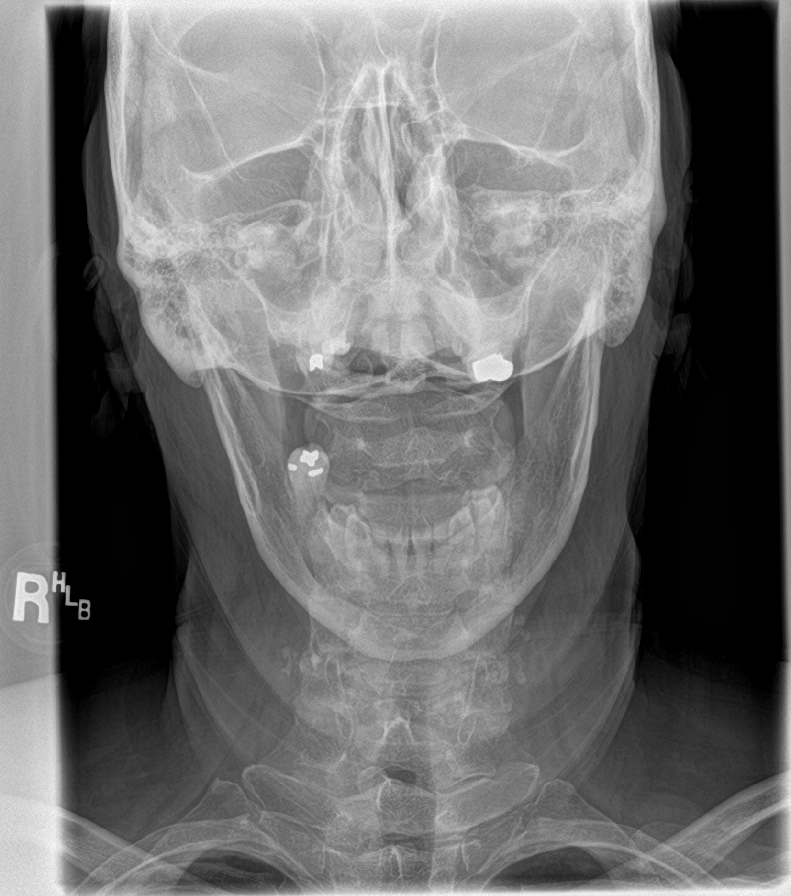
[im 2/2]
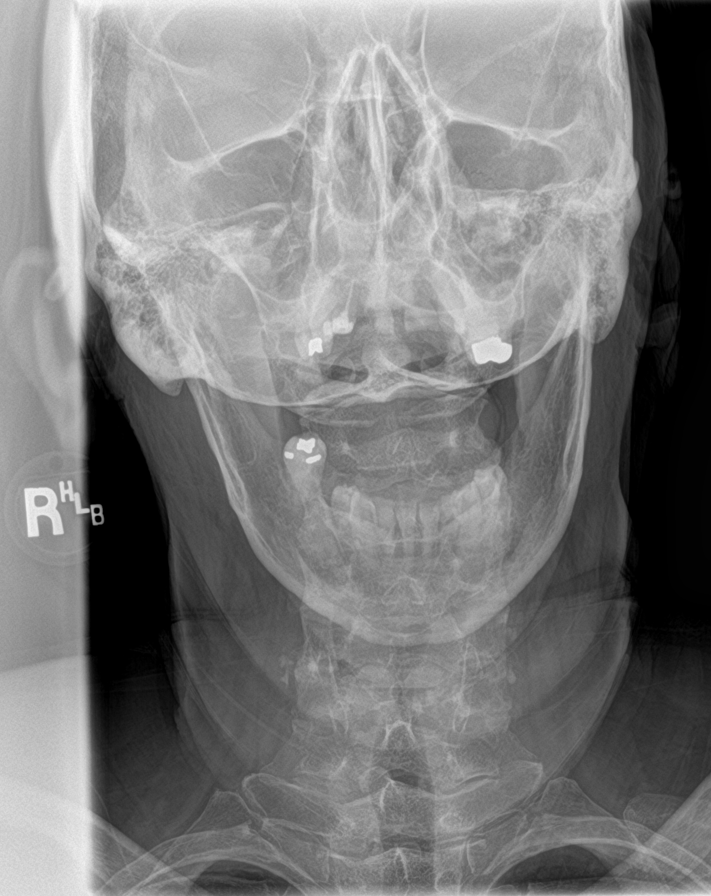

[c-spine swimmers]
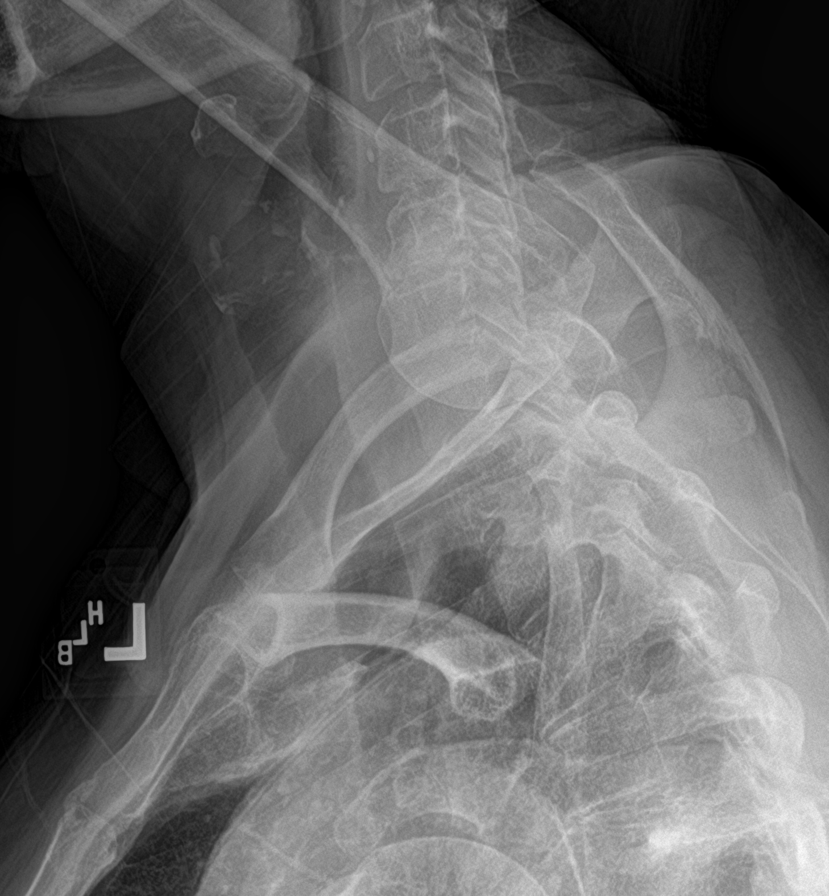

[7 of 7 positions shown; findings below may reference images not displayed]

FINDINGS: No prevertebral soft tissue swelling. Normal alignment of the
vertebral bodies. Normal spinal laminal line. Oblique projections
demonstrate no traumatic narrowing of the neural foramina. Open
mouth odontoid view demonstrates normal alignment of the lateral
masses of C1 on C2.

Mild endplate spurring and sclerosis at C5-C6.
IMPRESSION: 1. No acute findings the cervical spine.
2. No change from prior.
3. Disc osteophytic disease at C5-C6.

## 2022-05-13 ENCOUNTER — Ambulatory Visit (INDEPENDENT_AMBULATORY_CARE_PROVIDER_SITE_OTHER): Payer: 59

## 2022-05-13 DIAGNOSIS — I6522 Occlusion and stenosis of left carotid artery: Secondary | ICD-10-CM

## 2022-05-13 DIAGNOSIS — R200 Anesthesia of skin: Secondary | ICD-10-CM

## 2022-05-15 NOTE — Addendum Note (Signed)
Addended by: Joycelyn Schmid R on: 05/15/2022 03:50 PM   Modules accepted: Orders

## 2022-05-16 ENCOUNTER — Telehealth: Payer: Self-pay | Admitting: *Deleted

## 2022-05-16 NOTE — Telephone Encounter (Signed)
Spoke with patient and informed her per Dr Marjory Lies her carotid US is an abnormal study.  It shows Bilateral ICA stenosis on the data table (60-79%). Right subclavian stenosis also. I advised her he asked cardiology to review report and clarify data table vs summary. He  recommends CTA neck which he ordered , and then vascular surgery consult.  Answered her questions to her stated satisfaction. I advised her I'll send to our referral team to follow up on hits so she can get scheduled. Patient verbalized understanding, appreciation.

## 2022-05-20 ENCOUNTER — Telehealth: Payer: Self-pay | Admitting: *Deleted

## 2022-05-20 NOTE — Telephone Encounter (Signed)
I sent the patient a mychart message about scheduling her MRI and she read it on 05/16/22. I will ask GI to call her.

## 2022-05-20 NOTE — Telephone Encounter (Signed)
Called patient to follow up on MRI brain and CTA orders, confirm her PCP.  She stated PCP is Dr Terri Cameron. Her CTA is scheduled for 8/15, confirmed that with her. She has not heard about scheduling MRI yet. I advised will send note to MRI coordinators  here to check on it. Patient verbalized understanding, appreciation.

## 2022-05-20 NOTE — Telephone Encounter (Signed)
Noted  

## 2022-05-28 ENCOUNTER — Ambulatory Visit
Admission: RE | Admit: 2022-05-28 | Discharge: 2022-05-28 | Disposition: A | Discharge status: Home / Self Care | Payer: Commercial Managed Care - HMO | Source: Ambulatory Visit | Attending: Diagnostic Neuroimaging | Admitting: Diagnostic Neuroimaging

## 2022-05-28 DIAGNOSIS — I6521 Occlusion and stenosis of right carotid artery: Secondary | ICD-10-CM

## 2022-05-28 DIAGNOSIS — R200 Anesthesia of skin: Secondary | ICD-10-CM

## 2022-05-28 DIAGNOSIS — I771 Stricture of artery: Secondary | ICD-10-CM

## 2022-05-28 MED ORDER — IOPAMIDOL (ISOVUE-370) INJECTION 76%
75.0000 mL | Freq: Once | INTRAVENOUS | Status: AC | PRN
  Administered 2022-05-28: 75 mL via INTRAVENOUS

## 2022-05-29 ENCOUNTER — Ambulatory Visit
Admission: RE | Admit: 2022-05-29 | Discharge: 2022-05-29 | Disposition: A | Discharge status: Home / Self Care | Payer: 59 | Source: Ambulatory Visit | Attending: Diagnostic Neuroimaging | Admitting: Diagnostic Neuroimaging

## 2022-05-29 DIAGNOSIS — R200 Anesthesia of skin: Secondary | ICD-10-CM

## 2022-05-29 MED ORDER — GADOBENATE DIMEGLUMINE 529 MG/ML IV SOLN
14.0000 mL | Freq: Once | INTRAVENOUS | Status: AC | PRN
  Administered 2022-05-29: 14 mL via INTRAVENOUS

## 2022-06-08 ENCOUNTER — Encounter: Payer: Self-pay | Admitting: Diagnostic Neuroimaging

## 2022-06-11 ENCOUNTER — Other Ambulatory Visit: Payer: Self-pay | Admitting: Diagnostic Neuroimaging

## 2022-06-11 DIAGNOSIS — I771 Stricture of artery: Secondary | ICD-10-CM

## 2022-06-11 DIAGNOSIS — I871 Compression of vein: Secondary | ICD-10-CM

## 2022-06-12 ENCOUNTER — Telehealth: Payer: Self-pay | Admitting: *Deleted

## 2022-06-12 ENCOUNTER — Telehealth: Payer: Self-pay | Admitting: Diagnostic Neuroimaging

## 2022-06-12 NOTE — Telephone Encounter (Signed)
Sent to [facility] #  [phone]

## 2022-06-12 NOTE — Telephone Encounter (Signed)
Patient returned call. I advised her per Dr Marjory Lies her MRI brain was unremarkable. CTA neck showed Severe right subclavian stenosis --> refer to vascular surgery consult.  The referral was sent today , gave her # to call if she has not heard form them within a week.  Study also showed Bilateral ICA plaque without significant hemodynamic stenosis.  --> refer to vascular surgery; may need to consider conventional angiogram given carotid u/s results. She had no questions,  verbalized understanding, appreciation.

## 2022-06-12 NOTE — Telephone Encounter (Signed)
LVM requesting call back for results. 

## 2022-06-26 ENCOUNTER — Ambulatory Visit (INDEPENDENT_AMBULATORY_CARE_PROVIDER_SITE_OTHER): Payer: Commercial Managed Care - HMO | Admitting: Vascular Surgery

## 2022-06-26 ENCOUNTER — Encounter: Payer: Self-pay | Admitting: Vascular Surgery

## 2022-06-26 VITALS — BP 129/71 | HR 50 | Temp 98.1°F | Resp 20 | Ht 68.0 in | Wt 160.0 lb

## 2022-06-26 DIAGNOSIS — I6523 Occlusion and stenosis of bilateral carotid arteries: Secondary | ICD-10-CM | POA: Diagnosis not present

## 2022-06-26 DIAGNOSIS — I771 Stricture of artery: Secondary | ICD-10-CM | POA: Diagnosis not present

## 2022-06-26 MED ORDER — ROSUVASTATIN CALCIUM 10 MG PO TABS: 10.0000 mg | ORAL_TABLET | Freq: Every day | ORAL | 11 refills | Status: AC

## 2022-06-26 NOTE — Progress Notes (Signed)
Patient ID: Shandrea Lusk, female   DOB: [birth year], 61 y.o.   MRN: [id number]  Reason for Consult: New Patient (Initial Visit)   Referred by Suanne Marker, MD  Subjective:     HPI:  Elva Mauro is a 61 y.o. female recently found to have carotid and subclavian artery stenosis.  She is a current everyday smoker also has hypercholesterolemia.  She has been prescribed Crestor in the past does not take any aspirin.  She does not have any wounds on her right arm she is right-hand dominant does not have any issues using the right arm.  Denies any stroke, TIA or amaurosis.  She walks without limitation no personal or family history of aneurysms.  Past Medical History:  Diagnosis Date   Anxiety    Chronic pain    COPD (chronic obstructive pulmonary disease) (HCC)    mild   H/O vitamin D deficiency    Hypercholesterolemia    Insomnia    Sensory neuropathy    Family History  Problem Relation Age of Onset   Hyperlipidemia Mother    Hypertension Mother    Hyperlipidemia Father    Hypertension Father    Diabetes Father    Leukemia Brother    Past Surgical History:  Procedure Laterality Date   ABDOMINAL HYSTERECTOMY     ABLATION     CESAREAN SECTION     x2   LAPAROSCOPIC SALPINGO OOPHERECTOMY      Short Social History:  Social History   Tobacco Use   Smoking status: Every Day    Packs/day: 0.50    Types: Cigarettes   Smokeless tobacco: Never  Substance Use Topics   Alcohol use: Yes    Comment: occasional    No Known Allergies  Current Outpatient Medications  Medication Sig Dispense Refill   acetaminophen (TYLENOL) 500 MG tablet Take 500-1,000 mg by mouth every 6 (six) hours as needed for headache (or pain).     ALPRAZolam (XANAX) 0.5 MG tablet for sedation before MRI scan; take 1 tab 1 hour before scan; may repeat 1 tab 15 min before scan 3 tablet 0   celecoxib (CELEBREX) 200 MG capsule Take 200 mg by mouth 2 (two) times daily.     MAGNESIUM-OXIDE 400  (240 Mg) MG tablet Take 1 tablet by mouth daily.     pregabalin (LYRICA) 150 MG capsule Take 150 mg by mouth 2 (two) times daily.     rosuvastatin (CRESTOR) 5 MG tablet Take 5 mg by mouth daily.     Vitamin D, Ergocalciferol, (DRISDOL) 1.25 MG (50000 UNIT) CAPS capsule Take 50,000 Units by mouth every 14 (fourteen) days.     No current facility-administered medications for this visit.    Review of Systems  Constitutional:  Constitutional negative. HENT: HENT negative.  Eyes: Eyes negative.  Respiratory: Respiratory negative.  Cardiovascular: Cardiovascular negative.  GI: Gastrointestinal negative.  Musculoskeletal: Musculoskeletal negative.  Skin: Skin negative.  Neurological: Neurological negative. Hematologic: Hematologic/lymphatic negative.  Psychiatric: Psychiatric negative.        Objective:  Objective   Vitals:   06/26/22 0947  BP: 129/71  Pulse: (!) 50  Resp: 20  Temp: 98.1 F (36.7 C)  SpO2: 97%  Weight: 160 lb (72.6 kg)  Height: 5\' 8"  (1.727 m)   Body mass index is 24.33 kg/m.  Physical Exam HENT:     Head: Normocephalic.     Nose: Nose normal.  Eyes:     Pupils: Pupils are equal, round, and reactive to  light.  Neck:     Vascular: No carotid bruit.  Cardiovascular:     Rate and Rhythm: Normal rate.     Pulses:          Radial pulses are 0 on the right side and 2+ on the left side.  Abdominal:     General: Abdomen is flat.     Palpations: Abdomen is soft.  Skin:    General: Skin is warm and dry.     Capillary Refill: Capillary refill takes less than 2 seconds.  Neurological:     General: No focal deficit present.     Mental Status: She is alert.  Psychiatric:        Mood and Affect: Mood normal.        Behavior: Behavior normal.        Thought Content: Thought content normal.        Judgment: Judgment normal.     Data: CTA IMPRESSION: Heavy calcified plaque at the right subclavian origin with marked stenosis.   Plaque at the ICA  origins without hemodynamically significant stenosis.     Assessment/Plan:    61 year old female with bilateral carotid artery stenosis and occluded right subclavian artery by CT scan.  She is asymptomatic from the subclavian artery standpoint I recommended no blood pressure to be taken on that side.  I have also recommended 81 mg of aspirin daily and I will send Crestor to her pharmacy in [place].  We discussed the signs stroke which demonstrates good understanding.  There does appear to be a discrepancy between her recent carotid duplex and the CT scan which demonstrates minimal ICA stenosis but with calcifications.  We discussed the need for urgent smoking cessation.  She will follow-up in 1 year with repeat carotid duplex.     Maeola Harman MD [facility]

## 2022-06-28 ENCOUNTER — Other Ambulatory Visit: Payer: Self-pay

## 2022-06-28 DIAGNOSIS — I6523 Occlusion and stenosis of bilateral carotid arteries: Secondary | ICD-10-CM

## 2022-09-13 DIAGNOSIS — Z419 Encounter for procedure for purposes other than remedying health state, unspecified: Secondary | ICD-10-CM | POA: Diagnosis not present

## 2022-10-14 DIAGNOSIS — Z419 Encounter for procedure for purposes other than remedying health state, unspecified: Secondary | ICD-10-CM | POA: Diagnosis not present

## 2022-11-14 DIAGNOSIS — Z419 Encounter for procedure for purposes other than remedying health state, unspecified: Secondary | ICD-10-CM | POA: Diagnosis not present

## 2022-12-03 ENCOUNTER — Telehealth: Payer: Self-pay

## 2022-12-03 NOTE — Telephone Encounter (Signed)
Mychart msg sent. AS, CMA

## 2022-12-13 DIAGNOSIS — Z419 Encounter for procedure for purposes other than remedying health state, unspecified: Secondary | ICD-10-CM | POA: Diagnosis not present

## 2023-01-13 DIAGNOSIS — Z419 Encounter for procedure for purposes other than remedying health state, unspecified: Secondary | ICD-10-CM | POA: Diagnosis not present

## 2023-02-12 DIAGNOSIS — Z419 Encounter for procedure for purposes other than remedying health state, unspecified: Secondary | ICD-10-CM | POA: Diagnosis not present

## 2023-03-15 DIAGNOSIS — Z419 Encounter for procedure for purposes other than remedying health state, unspecified: Secondary | ICD-10-CM | POA: Diagnosis not present

## 2023-04-14 DIAGNOSIS — Z419 Encounter for procedure for purposes other than remedying health state, unspecified: Secondary | ICD-10-CM | POA: Diagnosis not present

## 2023-05-15 DIAGNOSIS — Z419 Encounter for procedure for purposes other than remedying health state, unspecified: Secondary | ICD-10-CM | POA: Diagnosis not present

## 2023-06-15 DIAGNOSIS — Z419 Encounter for procedure for purposes other than remedying health state, unspecified: Secondary | ICD-10-CM | POA: Diagnosis not present

## 2023-07-15 DIAGNOSIS — Z419 Encounter for procedure for purposes other than remedying health state, unspecified: Secondary | ICD-10-CM | POA: Diagnosis not present

## 2023-08-13 DIAGNOSIS — J440 Chronic obstructive pulmonary disease with acute lower respiratory infection: Secondary | ICD-10-CM | POA: Diagnosis not present

## 2023-08-13 DIAGNOSIS — R051 Acute cough: Secondary | ICD-10-CM | POA: Diagnosis not present

## 2023-08-15 DIAGNOSIS — Z419 Encounter for procedure for purposes other than remedying health state, unspecified: Secondary | ICD-10-CM | POA: Diagnosis not present

## 2023-09-14 DIAGNOSIS — Z419 Encounter for procedure for purposes other than remedying health state, unspecified: Secondary | ICD-10-CM | POA: Diagnosis not present

## 2023-10-15 DIAGNOSIS — Z419 Encounter for procedure for purposes other than remedying health state, unspecified: Secondary | ICD-10-CM | POA: Diagnosis not present

## 2023-11-15 DIAGNOSIS — Z419 Encounter for procedure for purposes other than remedying health state, unspecified: Secondary | ICD-10-CM | POA: Diagnosis not present

## 2023-12-13 DIAGNOSIS — Z419 Encounter for procedure for purposes other than remedying health state, unspecified: Secondary | ICD-10-CM | POA: Diagnosis not present

## 2024-01-24 DIAGNOSIS — Z419 Encounter for procedure for purposes other than remedying health state, unspecified: Secondary | ICD-10-CM | POA: Diagnosis not present

## 2024-02-23 DIAGNOSIS — Z419 Encounter for procedure for purposes other than remedying health state, unspecified: Secondary | ICD-10-CM | POA: Diagnosis not present

## 2024-03-25 DIAGNOSIS — Z419 Encounter for procedure for purposes other than remedying health state, unspecified: Secondary | ICD-10-CM | POA: Diagnosis not present

## 2024-04-24 DIAGNOSIS — Z419 Encounter for procedure for purposes other than remedying health state, unspecified: Secondary | ICD-10-CM | POA: Diagnosis not present

## 2024-05-25 DIAGNOSIS — Z419 Encounter for procedure for purposes other than remedying health state, unspecified: Secondary | ICD-10-CM | POA: Diagnosis not present

## 2024-06-25 DIAGNOSIS — Z419 Encounter for procedure for purposes other than remedying health state, unspecified: Secondary | ICD-10-CM | POA: Diagnosis not present
